# Patient Record
Sex: Female | Born: 1965 | Race: Black or African American | Hispanic: No | Marital: Married | Smoking: Never smoker
Health system: Southern US, Community
[De-identification: ages and names within clinical notes are randomized; demographics above are authoritative.]

## PROBLEM LIST (undated history)

## (undated) DIAGNOSIS — G43909 Migraine, unspecified, not intractable, without status migrainosus: Secondary | ICD-10-CM

## (undated) DIAGNOSIS — D649 Anemia, unspecified: Secondary | ICD-10-CM

## (undated) DIAGNOSIS — K219 Gastro-esophageal reflux disease without esophagitis: Secondary | ICD-10-CM

## (undated) DIAGNOSIS — K648 Other hemorrhoids: Secondary | ICD-10-CM

## (undated) DIAGNOSIS — B3781 Candidal esophagitis: Secondary | ICD-10-CM

## (undated) DIAGNOSIS — I10 Essential (primary) hypertension: Secondary | ICD-10-CM

## (undated) DIAGNOSIS — K635 Polyp of colon: Secondary | ICD-10-CM

## (undated) DIAGNOSIS — K254 Chronic or unspecified gastric ulcer with hemorrhage: Secondary | ICD-10-CM

## (undated) HISTORY — PX: TUBAL LIGATION: SHX77

## (undated) HISTORY — DX: Chronic or unspecified gastric ulcer with hemorrhage: K25.4

## (undated) HISTORY — DX: Candidal esophagitis: B37.81

## (undated) HISTORY — DX: Anemia, unspecified: D64.9

## (undated) HISTORY — PX: ABDOMINAL HYSTERECTOMY: SHX81

## (undated) HISTORY — PX: MULTIPLE TOOTH EXTRACTIONS: SHX2053

## (undated) HISTORY — PX: OTHER SURGICAL HISTORY: SHX169

## (undated) HISTORY — DX: Polyp of colon: K63.5

## (undated) HISTORY — DX: Other hemorrhoids: K64.8

## (undated) HISTORY — PX: FOOT SURGERY: SHX648

---

## 2006-12-06 ENCOUNTER — Emergency Department (HOSPITAL_COMMUNITY): Admission: EM | Admit: 2006-12-06 | Discharge: 2006-12-06 | Payer: Self-pay | Admitting: Emergency Medicine

## 2007-03-24 ENCOUNTER — Emergency Department (HOSPITAL_COMMUNITY): Admission: EM | Admit: 2007-03-24 | Discharge: 2007-03-24 | Payer: Self-pay | Admitting: Emergency Medicine

## 2007-04-24 ENCOUNTER — Emergency Department (HOSPITAL_COMMUNITY): Admission: EM | Admit: 2007-04-24 | Discharge: 2007-04-24 | Payer: Self-pay | Admitting: Emergency Medicine

## 2010-05-24 ENCOUNTER — Emergency Department (HOSPITAL_COMMUNITY)
Admission: EM | Admit: 2010-05-24 | Discharge: 2010-05-24 | Disposition: A | Discharge status: Home / Self Care | Payer: Self-pay | Attending: Emergency Medicine | Admitting: Emergency Medicine

## 2010-05-24 DIAGNOSIS — G44209 Tension-type headache, unspecified, not intractable: Secondary | ICD-10-CM | POA: Insufficient documentation

## 2010-05-24 DIAGNOSIS — R110 Nausea: Secondary | ICD-10-CM | POA: Insufficient documentation

## 2010-05-24 DIAGNOSIS — I10 Essential (primary) hypertension: Secondary | ICD-10-CM | POA: Insufficient documentation

## 2010-05-24 LAB — URINALYSIS, ROUTINE W REFLEX MICROSCOPIC
Nitrite: NEGATIVE
Protein, ur: NEGATIVE mg/dL
Urine Glucose, Fasting: NEGATIVE mg/dL
Urobilinogen, UA: 1 mg/dL (ref 0.0–1.0)

## 2010-05-24 LAB — DIFFERENTIAL
Basophils Absolute: 0 10*3/uL (ref 0.0–0.1)
Basophils Relative: 0 % (ref 0–1)
Eosinophils Absolute: 0 10*3/uL (ref 0.0–0.7)
Neutrophils Relative %: 75 % (ref 43–77)

## 2010-05-24 LAB — COMPREHENSIVE METABOLIC PANEL
AST: 18 U/L (ref 0–37)
Albumin: 3.8 g/dL (ref 3.5–5.2)
Alkaline Phosphatase: 50 U/L (ref 39–117)
Chloride: 107 mEq/L (ref 96–112)
Creatinine, Ser: 0.84 mg/dL (ref 0.4–1.2)
GFR calc Af Amer: >60 mL/min (ref >60)
Potassium: 3.8 mEq/L (ref 3.5–5.1)
Total Bilirubin: 0.6 mg/dL (ref 0.3–1.2)
Total Protein: 7.1 g/dL (ref 6.0–8.3)

## 2010-05-24 LAB — CBC
Platelets: 207 10*3/uL (ref 150–400)
RBC: 4.61 MIL/uL (ref 3.87–5.11)
WBC: 5.8 10*3/uL (ref 4.0–10.5)

## 2011-03-31 ENCOUNTER — Emergency Department (HOSPITAL_COMMUNITY)
Admission: EM | Admit: 2011-03-31 | Discharge: 2011-03-31 | Disposition: A | Discharge status: Home / Self Care | Payer: Self-pay | Attending: Emergency Medicine | Admitting: Emergency Medicine

## 2011-03-31 ENCOUNTER — Emergency Department (HOSPITAL_COMMUNITY): Payer: Self-pay

## 2011-03-31 ENCOUNTER — Encounter: Payer: Self-pay | Admitting: *Deleted

## 2011-03-31 DIAGNOSIS — M545 Low back pain, unspecified: Secondary | ICD-10-CM | POA: Insufficient documentation

## 2011-03-31 DIAGNOSIS — M549 Dorsalgia, unspecified: Secondary | ICD-10-CM | POA: Insufficient documentation

## 2011-03-31 LAB — URINALYSIS, ROUTINE W REFLEX MICROSCOPIC
Bilirubin Urine: NEGATIVE
Glucose, UA: NEGATIVE mg/dL
Hgb urine dipstick: NEGATIVE
Specific Gravity, Urine: 1.014 (ref 1.005–1.030)
Urobilinogen, UA: 0.2 mg/dL (ref 0.0–1.0)

## 2011-03-31 MED ORDER — METHOCARBAMOL 500 MG PO TABS: 1000.0000 mg | ORAL_TABLET | Freq: Four times a day (QID) | ORAL | Status: AC

## 2011-03-31 MED ORDER — IBUPROFEN 800 MG PO TABS: 800.0000 mg | ORAL_TABLET | Freq: Three times a day (TID) | ORAL | Status: AC | PRN

## 2011-03-31 MED ORDER — OXYCODONE-ACETAMINOPHEN 5-325 MG PO TABS
1.0000 | ORAL_TABLET | Freq: Once | ORAL | Status: AC
  Administered 2011-03-31: 1 via ORAL
  Filled 2011-03-31: qty 1

## 2011-03-31 MED ORDER — HYDROCODONE-ACETAMINOPHEN 5-325 MG PO TABS: ORAL_TABLET | ORAL | Status: AC

## 2011-03-31 NOTE — ED Provider Notes (Signed)
History     CSN: [id number]  Arrival date & time 03/31/11  1800   First MD Initiated Contact with Patient 03/31/11 2030      Chief Complaint  Patient presents with  . Back Pain    (Consider location/radiation/quality/duration/timing/severity/associated sxs/prior treatment) HPI Comments: Patient with back pain for the past 2 weeks. Pain is midline of her lumbar spine. Patient states she has radiation of the pain into her right lower extremity. Pain is worse with certain positions and movements. Pain is worse when she coughs. Patient states she has more pain when she bears down to urinate. She denies hematuria or dysuria. Patient denies history of cancer, IV drug use, urinary or bowel incontinence, weakness numbness or tingling in her lower extremities. Patient does state that she has lost 10-15 pounds over the past month. No treatments prior.  Patient is a 45 y.o. female presenting with back pain. The history is provided by the patient.  Back Pain  This is a new problem. The current episode started more than 1 week ago. The problem has been gradually worsening. The pain is associated with no known injury. The pain is present in the lumbar spine. The pain radiates to the right thigh. The symptoms are aggravated by bending and twisting. Associated symptoms include weight loss. Pertinent negatives include no fever, no numbness, no bowel incontinence, no perianal numbness, no bladder incontinence, no pelvic pain, no paresthesias, no tingling and no weakness. She has tried nothing for the symptoms.    History reviewed. No pertinent past medical history.  Past Surgical History  Procedure Date  . Abdominal hysterectomy   . Knee     left "ligament got tore up" surgery  . Tubal ligation     History reviewed. No pertinent family history.  History  Substance Use Topics  . Smoking status: Not on file  . Smokeless tobacco: Not on file  . Alcohol Use:     OB History    Grav Para Term  Preterm Abortions TAB SAB Ect Mult Living                  Review of Systems  Constitutional: Positive for weight loss. Negative for fever.  Gastrointestinal: Negative for bowel incontinence.  Genitourinary: Negative for bladder incontinence and pelvic pain.  Musculoskeletal: Positive for back pain.  Neurological: Negative for tingling, weakness, numbness and paresthesias.    Allergies  Review of patient's allergies indicates no known allergies.  Home Medications  No current outpatient prescriptions on file.  BP 131/91  Pulse 75  Temp(Src) 98.8 F (37.1 C) (Oral)  Resp 16  SpO2 98%  Physical Exam  Nursing note and vitals reviewed. Constitutional: She is oriented to person, place, and time. She appears well-developed and well-nourished.  HENT:  Head: Normocephalic and atraumatic.  Eyes: Conjunctivae are normal.  Neck: Normal range of motion. Neck supple.  Pulmonary/Chest: Effort normal.  Abdominal: Soft. There is no tenderness.  Musculoskeletal: Normal range of motion.       Tenderness to palpation over lumbar/sacral spine. Tenderness to palpation over lumbar paraspinal muscles. No step-off noted with palpation of spine.   Neurological: She is alert and oriented to person, place, and time. She has normal reflexes.  Skin: Skin is warm and dry. No rash noted.  Psychiatric: She has a normal mood and affect.    ED Course  Procedures (including critical care time)   Labs Reviewed  URINALYSIS, ROUTINE W REFLEX MICROSCOPIC   Dg Lumbar Spine Complete  03/31/2011  *RADIOLOGY REPORT*  Clinical Data: Low back pain for 10 days  LUMBAR SPINE - COMPLETE 4+ VIEW  Comparison: None.  Findings: Six non-rib bearing lumbar type vertebrae.  Normal alignment of the lumbar vertebrae and facet joints.  No vertebral compression deformities.  Intervertebral disc space heights are preserved.  No focal bone lesion or bone destruction suggested. Bone cortex and trabecular architecture appear  intact.  IMPRESSION: No displaced fractures identified.  Original Report Authenticated By: Marlon Pel, M.D.     1. Back pain     10:30 PM patient seen and examined. UA and x-ray ordered and are negative. Patient was counseled on back pain precautions and told to do activity as tolerated but do not lift, push, or pull heavy objects more than 10 pounds.  Patient counseled to use ice or heat on back for no longer than 15 minutes every hour.  Questions answered.  Patient verbalized understanding.    10:31 PM Patient counseled on use of narcotic pain medications. Counseled not to combine these medications with others containing tylenol. Urged not to drink alcohol, drive, or perform any other activities that requires focus while taking these medications. The patient verbalizes understanding and agrees with the plan.  10:31 PM Patient counseled on proper use of muscle relaxant medication.  They were told not to drink alcohol, drive any vehicle, or do any dangerous activities while taking this medication.  Patient verbalized understanding.    MDM  Patient with back pain.  No neurological deficits and normal neuro exam.  X-ray ordered given midline tenderness as well as reported weight loss. UA is negative. Patient can walk but states is painful.  No loss of bowel or bladder control.  No concern for cauda equina.  No fever, night sweats, h/o cancer, IVDU.  RICE protocol and pain medicine indicated.           Eustace Moore [place], [place] 03/31/11 2236

## 2011-03-31 NOTE — ED Notes (Signed)
Pt states that she had "the flu" about 2 weeks ago when she noticed a pain in the right side of her back and right flank area.  Pt states that when she coughs the pain is worse and the pain is noted to be gradually worsening.  Pt denies any other mechanism of injury or hematuria.  Pt denies hx of same.

## 2011-04-01 NOTE — ED Provider Notes (Signed)
Medical screening examination/treatment/procedure(s) were performed by non-physician practitioner and as supervising physician I was immediately available for consultation/collaboration.    Burgundy Matuszak L Berna Gitto, MD 04/01/11 1127 

## 2013-04-21 ENCOUNTER — Emergency Department (HOSPITAL_COMMUNITY): Payer: Self-pay

## 2013-04-21 ENCOUNTER — Encounter (HOSPITAL_COMMUNITY): Payer: Self-pay | Admitting: Emergency Medicine

## 2013-04-21 ENCOUNTER — Emergency Department (HOSPITAL_COMMUNITY)
Admission: EM | Admit: 2013-04-21 | Discharge: 2013-04-21 | Disposition: A | Discharge status: Home / Self Care | Payer: Self-pay | Attending: Emergency Medicine | Admitting: Emergency Medicine

## 2013-04-21 DIAGNOSIS — R112 Nausea with vomiting, unspecified: Secondary | ICD-10-CM | POA: Insufficient documentation

## 2013-04-21 DIAGNOSIS — R51 Headache: Secondary | ICD-10-CM | POA: Insufficient documentation

## 2013-04-21 DIAGNOSIS — I10 Essential (primary) hypertension: Secondary | ICD-10-CM | POA: Insufficient documentation

## 2013-04-21 DIAGNOSIS — R519 Headache, unspecified: Secondary | ICD-10-CM

## 2013-04-21 HISTORY — DX: Migraine, unspecified, not intractable, without status migrainosus: G43.909

## 2013-04-21 HISTORY — DX: Essential (primary) hypertension: I10

## 2013-04-21 LAB — POCT I-STAT, CHEM 8
BUN: 7 mg/dL (ref 6–23)
Calcium, Ion: 1.19 mmol/L (ref 1.12–1.23)
Chloride: 97 meq/L (ref 96–112)
Creatinine, Ser: 1 mg/dL (ref 0.50–1.10)
Glucose, Bld: 111 mg/dL — ABNORMAL HIGH (ref 70–99)
HCT: 49 % — ABNORMAL HIGH (ref 36.0–46.0)
Hemoglobin: 16.7 g/dL — ABNORMAL HIGH (ref 12.0–15.0)
Potassium: 3.4 meq/L — ABNORMAL LOW (ref 3.7–5.3)
Sodium: 142 meq/L (ref 137–147)
TCO2: 31 mmol/L (ref 0–100)

## 2013-04-21 MED ORDER — DIPHENHYDRAMINE HCL 50 MG/ML IJ SOLN
25.0000 mg | Freq: Once | INTRAMUSCULAR | Status: AC
  Administered 2013-04-21: 25 mg via INTRAVENOUS
  Filled 2013-04-21: qty 1

## 2013-04-21 MED ORDER — SODIUM CHLORIDE 0.9 % IV SOLN
INTRAVENOUS | Status: DC
  Administered 2013-04-21: 13:00:00 via INTRAVENOUS

## 2013-04-21 MED ORDER — AMLODIPINE BESYLATE 5 MG PO TABS: 5.0000 mg | ORAL_TABLET | Freq: Every day | ORAL | Status: DC

## 2013-04-21 MED ORDER — OXYCODONE-ACETAMINOPHEN 5-325 MG PO TABS: 2.0000 | ORAL_TABLET | ORAL | Status: DC | PRN

## 2013-04-21 MED ORDER — METOCLOPRAMIDE HCL 5 MG/ML IJ SOLN
10.0000 mg | Freq: Once | INTRAMUSCULAR | Status: AC
  Administered 2013-04-21: 10 mg via INTRAVENOUS
  Filled 2013-04-21: qty 2

## 2013-04-21 MED ORDER — MORPHINE SULFATE 2 MG/ML IJ SOLN
2.0000 mg | Freq: Once | INTRAMUSCULAR | Status: AC
  Administered 2013-04-21: 2 mg via INTRAVENOUS
  Filled 2013-04-21 (×2): qty 1

## 2013-04-21 NOTE — ED Provider Notes (Signed)
CSN: [id number]     Arrival date & time 04/21/13  1107 History   First MD Initiated Contact with Patient 04/21/13 1221     Chief Complaint  Patient presents with  . Headache  . Nausea  . Emesis   (Consider location/radiation/quality/duration/timing/severity/associated sxs/prior Treatment) Patient is a 48 y.o. female presenting with headaches and vomiting. The history is provided by the patient.  Headache Associated symptoms: vomiting   Emesis Associated symptoms: headaches    patient here complaining of a two-day history of bitemporal headache similar to her prior migraines. She has had emesis x3 without fever or neck pain. Has not taken her blood pressure medications for over 2 months because of financial reasons. Denies syncope or near-syncope. No chest pain or shortness of breath. Does note some photophobia and phonophobia without visual changes. Use over-the-counter medications without relief.  Past Medical History  Diagnosis Date  . Hypertension   . Migraines    Past Surgical History  Procedure Laterality Date  . Abdominal hysterectomy    . Knee      left "ligament got tore up" surgery  . Tubal ligation     History reviewed. No pertinent family history. History  Substance Use Topics  . Smoking status: Never Smoker   . Smokeless tobacco: Not on file  . Alcohol Use: No   OB History   Grav Para Term Preterm Abortions TAB SAB Ect Mult Living                 Review of Systems  Gastrointestinal: Positive for vomiting.  Neurological: Positive for headaches.  All other systems reviewed and are negative.    Allergies  Review of patient's allergies indicates no known allergies.  Home Medications   Current Outpatient Rx  Name  Route  Sig  Dispense  Refill  . ibuprofen (ADVIL,MOTRIN) 200 MG tablet   Oral   Take 400 mg by mouth every 6 (six) hours as needed.          BP 183/122  Pulse 73  Temp(Src) 98.3 F (36.8 C) (Oral)  Resp 16  SpO2 97% Physical Exam   Nursing note and vitals reviewed. Constitutional: She is oriented to person, place, and time. She appears well-developed and well-nourished.  Non-toxic appearance. No distress.  HENT:  Head: Normocephalic and atraumatic.  Eyes: Conjunctivae, EOM and lids are normal. Pupils are equal, round, and reactive to light.  Neck: Normal range of motion. Neck supple. No tracheal deviation present. No mass present.  Cardiovascular: Normal rate, regular rhythm and normal heart sounds.  Exam reveals no gallop.   No murmur heard. Pulmonary/Chest: Effort normal and breath sounds normal. No stridor. No respiratory distress. She has no decreased breath sounds. She has no wheezes. She has no rhonchi. She has no rales.  Abdominal: Soft. Normal appearance and bowel sounds are normal. She exhibits no distension. There is no tenderness. There is no rebound and no CVA tenderness.  Musculoskeletal: Normal range of motion. She exhibits no edema and no tenderness.  Neurological: She is alert and oriented to person, place, and time. She has normal strength. No cranial nerve deficit or sensory deficit. Coordination and gait normal. GCS eye subscore is 4. GCS verbal subscore is 5. GCS motor subscore is 6.  Skin: Skin is warm and dry. No abrasion and no rash noted.  Psychiatric: She has a normal mood and affect. Her speech is normal and behavior is normal.    ED Course  Procedures (including critical care time)  Labs Review Labs Reviewed - No data to display Imaging Review No results found.  EKG Interpretation   None       MDM  No diagnosis found. Patient given meds for her headache and does feel better. Neurological assessment stable. Will be started on Norvasc for hypertension and given referral to the [facility], MD 04/21/13 1346

## 2013-04-21 NOTE — Discharge Instructions (Signed)
Arterial Hypertension °Arterial hypertension (high blood pressure) is a condition of elevated pressure in your blood vessels. Hypertension over a long period of time is a risk factor for strokes, heart attacks, and heart failure. It is also the leading cause of kidney (renal) failure.  °CAUSES  °· In Adults -- Over 90% of all hypertension has no known cause. This is called essential or primary hypertension. In the other 10% of people with hypertension, the increase in blood pressure is caused by another disorder. This is called secondary hypertension. Important causes of secondary hypertension are: °· Heavy alcohol use. °· Obstructive sleep apnea. °· Hyperaldosterosim (Conn's syndrome). °· Steroid use. °· Chronic kidney failure. °· Hyperparathyroidism. °· Medications. °· Renal artery stenosis. °· Pheochromocytoma. °· Cushing's disease. °· Coarctation of the aorta. °· Scleroderma renal crisis. °· Licorice (in excessive amounts). °· Drugs (cocaine, methamphetamine). °Your caregiver can explain any items above that apply to you. °· In Children -- Secondary hypertension is more common and should always be considered. °· Pregnancy -- Few women of childbearing age have high blood pressure. However, up to 10% of them develop hypertension of pregnancy. Generally, this will not harm the woman. It may be a sign of 3 complications of pregnancy: preeclampsia, HELLP syndrome, and eclampsia. Follow up and control with medication is necessary. °SYMPTOMS  °· This condition normally does not produce any noticeable symptoms. It is usually found during a routine exam. °· Malignant hypertension is a late problem of high blood pressure. It may have the following symptoms: °· Headaches. °· Blurred vision. °· End-organ damage (this means your kidneys, heart, lungs, and other organs are being damaged). °· Stressful situations can increase the blood pressure. If a person with normal blood pressure has their blood pressure go up while being  seen by their caregiver, this is often termed "white coat hypertension." Its importance is not known. It may be related with eventually developing hypertension or complications of hypertension. °· Hypertension is often confused with mental tension, stress, and anxiety. °DIAGNOSIS  °The diagnosis is made by 3 separate blood pressure measurements. They are taken at least 1 week apart from each other. If there is organ damage from hypertension, the diagnosis may be made without repeat measurements. °Hypertension is usually identified by having blood pressure readings: °· Above 140/90 mmHg measured in both arms, at 3 separate times, over a couple weeks. °· Over 130/80 mmHg should be considered a risk factor and may require treatment in patients with diabetes. °Blood pressure readings over 120/80 mmHg are called "pre-hypertension" even in non-diabetic patients. °To get a true blood pressure measurement, use the following guidelines. Be aware of the factors that can alter blood pressure readings. °· Take measurements at least 1 hour after caffeine. °· Take measurements 30 minutes after smoking and without any stress. This is another reason to quit smoking  it raises your blood pressure. °· Use a proper cuff size. Ask your caregiver if you are not sure about your cuff size. °· Most home blood pressure cuffs are automatic. They will measure systolic and diastolic pressures. The systolic pressure is the pressure reading at the start of sounds. Diastolic pressure is the pressure at which the sounds disappear. If you are elderly, measure pressures in multiple postures. Try sitting, lying or standing. °· Sit at rest for a minimum of 5 minutes before taking measurements. °· You should not be on any medications like decongestants. These are found in many cold medications. °· Record your blood pressure readings and review   them with your caregiver. °If you have hypertension: °· Your caregiver may do tests to be sure you do not have  secondary hypertension (see "causes" above). °· Your caregiver may also look for signs of metabolic syndrome. This is also called Syndrome X or Insulin Resistance Syndrome. You may have this syndrome if you have type 2 diabetes, abdominal obesity, and abnormal blood lipids in addition to hypertension. °· Your caregiver will take your medical and family history and perform a physical exam. °· Diagnostic tests may include blood tests (for glucose, cholesterol, potassium, and kidney function), a urinalysis, or an EKG. Other tests may also be necessary depending on your condition. °PREVENTION  °There are important lifestyle issues that you can adopt to reduce your chance of developing hypertension: °· Maintain a normal weight. °· Limit the amount of salt (sodium) in your diet. °· Exercise often. °· Limit alcohol intake. °· Get enough potassium in your diet. Discuss specific advice with your caregiver. °· Follow a DASH diet (dietary approaches to stop hypertension). This diet is rich in fruits, vegetables, and low-fat dairy products, and avoids certain fats. °PROGNOSIS  °Essential hypertension cannot be cured. Lifestyle changes and medical treatment can lower blood pressure and reduce complications. The prognosis of secondary hypertension depends on the underlying cause. Many people whose hypertension is controlled with medicine or lifestyle changes can live a normal, healthy life.  °RISKS AND COMPLICATIONS  °While high blood pressure alone is not an illness, it often requires treatment due to its short- and long-term effects on many organs. Hypertension increases your risk for: °· CVAs or strokes (cerebrovascular accident). °· Heart failure due to chronically high blood pressure (hypertensive cardiomyopathy). °· Heart attack (myocardial infarction). °· Damage to the retina (hypertensive retinopathy). °· Kidney failure (hypertensive nephropathy). °Your caregiver can explain list items above that apply to you. Treatment  of hypertension can significantly reduce the risk of complications. °TREATMENT  °· For overweight patients, weight loss and regular exercise are recommended. Physical fitness lowers blood pressure. °· Mild hypertension is usually treated with diet and exercise. A diet rich in fruits and vegetables, fat-free dairy products, and foods low in fat and salt (sodium) can help lower blood pressure. Decreasing salt intake decreases blood pressure in a 1/3 of people. °· Stop smoking if you are a smoker. °The steps above are highly effective in reducing blood pressure. While these actions are easy to suggest, they are difficult to achieve. Most patients with moderate or severe hypertension end up requiring medications to bring their blood pressure down to a normal level. There are several classes of medications for treatment. Blood pressure pills (antihypertensives) will lower blood pressure by their different actions. Lowering the blood pressure by 10 mmHg may decrease the risk of complications by as much as 25%. °The goal of treatment is effective blood pressure control. This will reduce your risk for complications. Your caregiver will help you determine the best treatment for you according to your lifestyle. What is excellent treatment for one person, may not be for you. °HOME CARE INSTRUCTIONS  °· Do not smoke. °· Follow the lifestyle changes outlined in the "Prevention" section. °· If you are on medications, follow the directions carefully. Blood pressure medications must be taken as prescribed. Skipping doses reduces their benefit. It also puts you at risk for problems. °· Follow up with your caregiver, as directed. °· If you are asked to monitor your blood pressure at home, follow the guidelines in the "Diagnosis" section above. °SEEK MEDICAL CARE   IF:  °· You think you are having medication side effects. °· You have recurrent headaches or lightheadedness. °· You have swelling in your ankles. °· You have trouble with  your vision. °SEEK IMMEDIATE MEDICAL CARE IF:  °· You have sudden onset of chest pain or pressure, difficulty breathing, or other symptoms of a heart attack. °· You have a severe headache. °· You have symptoms of a stroke (such as sudden weakness, difficulty speaking, difficulty walking). °MAKE SURE YOU:  °· Understand these instructions. °· Will watch your condition. °· Will get help right away if you are not doing well or get worse. °Document Released: 03/19/2005 Document Revised: 06/11/2011 Document Reviewed: 10/17/2006 °Exit[employer]® Patient Information ©2014 [employer]. ° °Migraine Headache °A migraine headache is an intense, throbbing pain on one or both sides of your head. A migraine can last for 30 minutes to several hours. °CAUSES  °The exact cause of a migraine headache is not always known. However, a migraine may be caused when nerves in the brain become irritated and release chemicals that cause inflammation. This causes pain. °Certain things may also trigger migraines, such as: °· Alcohol. °· Smoking. °· Stress. °· Menstruation. °· Aged cheeses. °· Foods or drinks that contain nitrates, glutamate, aspartame, or tyramine. °· Lack of sleep. °· Chocolate. °· Caffeine. °· Hunger. °· Physical exertion. °· Fatigue. °· Medicines used to treat chest pain (nitroglycerine), birth control pills, estrogen, and some blood pressure medicines. °SIGNS AND SYMPTOMS °· Pain on one or both sides of your head. °· Pulsating or throbbing pain. °· Severe pain that prevents daily activities. °· Pain that is aggravated by any physical activity. °· Nausea, vomiting, or both. °· Dizziness. °· Pain with exposure to bright lights, loud noises, or activity. °· General sensitivity to bright lights, loud noises, or smells. °Before you get a migraine, you may get warning signs that a migraine is coming (aura). An aura may include: °· Seeing flashing lights. °· Seeing bright spots, halos, or zig-zag lines. °· Having tunnel vision or  blurred vision. °· Having feelings of numbness or tingling. °· Having trouble talking. °· Having muscle weakness. °DIAGNOSIS  °A migraine headache is often diagnosed based on: °· Symptoms. °· Physical exam. °· A CT scan or MRI of your head. These imaging tests cannot diagnose migraines, but they can help rule out other causes of headaches. °TREATMENT °Medicines may be given for pain and nausea. Medicines can also be given to help prevent recurrent migraines.  °HOME CARE INSTRUCTIONS °· Only take over-the-counter or prescription medicines for pain or discomfort as directed by your health care provider. The use of long-term narcotics is not recommended. °· Lie down in a dark, quiet room when you have a migraine. °· Keep a journal to find out what may trigger your migraine headaches. For example, write down: °· What you eat and drink. °· How much sleep you get. °· Any change to your diet or medicines. °· Limit alcohol consumption. °· Quit smoking if you smoke. °· Get 7 9 hours of sleep, or as recommended by your health care provider. °· Limit stress. °· Keep lights dim if bright lights bother you and make your migraines worse. °SEEK IMMEDIATE MEDICAL CARE IF:  °· Your migraine becomes severe. °· You have a fever. °· You have a stiff neck. °· You have vision loss. °· You have muscular weakness or loss of muscle control. °· You start losing your balance or have trouble walking. °· You feel faint or pass out. °·   You have severe symptoms that are different from your first symptoms. °MAKE SURE YOU:  °· Understand these instructions. °· Will watch your condition. °· Will get help right away if you are not doing well or get worse. °Document Released: 03/19/2005 Document Revised: 01/07/2013 Document Reviewed: 11/24/2012 °ExitCare® Patient Information ©2014 [employer]. ° °

## 2013-04-21 NOTE — Progress Notes (Signed)
[facility] provided pt with a list of primary care resources, [facility] information, and [facility] Orange Card application. °

## 2013-04-21 NOTE — ED Notes (Signed)
Pt w/ hx of migraines and htn c/o headache since yesterday w/ NV.  States she has thrown up 3 times.  States that she cannot afford her BP meds.

## 2013-04-21 NOTE — ED Notes (Signed)
Patient transported to CT 

## 2013-05-25 ENCOUNTER — Ambulatory Visit: Payer: Self-pay

## 2014-04-02 DIAGNOSIS — K254 Chronic or unspecified gastric ulcer with hemorrhage: Secondary | ICD-10-CM

## 2014-04-02 HISTORY — DX: Chronic or unspecified gastric ulcer with hemorrhage: K25.4

## 2014-06-02 ENCOUNTER — Emergency Department (HOSPITAL_COMMUNITY)
Admission: EM | Admit: 2014-06-02 | Discharge: 2014-06-02 | Disposition: A | Discharge status: Home / Self Care | Payer: Self-pay | Attending: Emergency Medicine | Admitting: Emergency Medicine

## 2014-06-02 ENCOUNTER — Encounter (HOSPITAL_COMMUNITY): Payer: Self-pay | Admitting: Emergency Medicine

## 2014-06-02 ENCOUNTER — Emergency Department (HOSPITAL_COMMUNITY): Payer: Self-pay

## 2014-06-02 DIAGNOSIS — R519 Headache, unspecified: Secondary | ICD-10-CM

## 2014-06-02 DIAGNOSIS — G43909 Migraine, unspecified, not intractable, without status migrainosus: Secondary | ICD-10-CM | POA: Insufficient documentation

## 2014-06-02 DIAGNOSIS — R51 Headache: Secondary | ICD-10-CM

## 2014-06-02 DIAGNOSIS — Z79899 Other long term (current) drug therapy: Secondary | ICD-10-CM | POA: Insufficient documentation

## 2014-06-02 DIAGNOSIS — I10 Essential (primary) hypertension: Secondary | ICD-10-CM

## 2014-06-02 MED ORDER — METOCLOPRAMIDE HCL 10 MG PO TABS
10.0000 mg | ORAL_TABLET | Freq: Once | ORAL | Status: DC
  Filled 2014-06-02: qty 1

## 2014-06-02 MED ORDER — METOCLOPRAMIDE HCL 5 MG/ML IJ SOLN
10.0000 mg | Freq: Once | INTRAMUSCULAR | Status: AC
  Administered 2014-06-02: 10 mg via INTRAVENOUS
  Filled 2014-06-02: qty 2

## 2014-06-02 MED ORDER — AMLODIPINE BESYLATE 5 MG PO TABS: 5.0000 mg | ORAL_TABLET | Freq: Every day | ORAL | Status: DC

## 2014-06-02 MED ORDER — DIPHENHYDRAMINE HCL 50 MG/ML IJ SOLN
25.0000 mg | Freq: Once | INTRAMUSCULAR | Status: AC
  Administered 2014-06-02: 25 mg via INTRAVENOUS
  Filled 2014-06-02: qty 1

## 2014-06-02 MED ORDER — KETOROLAC TROMETHAMINE 30 MG/ML IJ SOLN
30.0000 mg | Freq: Once | INTRAMUSCULAR | Status: AC
  Administered 2014-06-02: 30 mg via INTRAVENOUS
  Filled 2014-06-02: qty 1

## 2014-06-02 NOTE — ED Notes (Signed)
Pt c/o headache onset yesterday with nausea and vomiting.  Pt has hx of HTN but does not take any meds for same

## 2014-06-02 NOTE — Discharge Instructions (Signed)

## 2014-06-02 NOTE — ED Notes (Signed)
Patient transported to CT 

## 2014-06-02 NOTE — ED Provider Notes (Signed)
CSN: [id number]     Arrival date & time 06/02/14  1817 History   First MD Initiated Contact with Patient 06/02/14 1941     Chief Complaint  Patient presents with  . Headache     (Consider location/radiation/quality/duration/timing/severity/associated sxs/prior Treatment) HPI Comments: Frontal headache gradual onset since yesterday with 2 episodes of nausea and vomiting. History of hypertension noncompliance with medications. No fever. No focal weakness, numbness or tingling. No bowel or bladder incontinence. No chest pain or shortness of breath. Denies thunderclap onset. Denies any fever. Took ibuprofen without relief.  The history is provided by the patient.    Past Medical History  Diagnosis Date  . Hypertension   . Migraines    Past Surgical History  Procedure Laterality Date  . Abdominal hysterectomy    . Knee      left "ligament got tore up" surgery  . Tubal ligation     No family history on file. History  Substance Use Topics  . Smoking status: Never Smoker   . Smokeless tobacco: Not on file  . Alcohol Use: No   OB History    No data available     Review of Systems  Constitutional: Negative for fever, activity change and appetite change.  HENT: Negative for congestion and rhinorrhea.   Eyes: Positive for photophobia. Negative for visual disturbance.  Respiratory: Negative for cough, chest tightness and shortness of breath.   Cardiovascular: Negative for chest pain.  Gastrointestinal: Negative for nausea, vomiting and abdominal pain.  Genitourinary: Negative for dysuria, hematuria, vaginal bleeding and vaginal discharge.  Musculoskeletal: Negative for myalgias and arthralgias.  Skin: Negative for rash.  Neurological: Positive for weakness and headaches. Negative for dizziness and light-headedness.  A complete 10 system review of systems was obtained and all systems are negative except as noted in the HPI and PMH.      Allergies  Review of patient's allergies  indicates no known allergies.  Home Medications   Prior to Admission medications   Medication Sig Start Date End Date Taking? Authorizing Provider  ibuprofen (ADVIL,MOTRIN) 200 MG tablet Take 400 mg by mouth every 6 (six) hours as needed for headache, mild pain or moderate pain.    Yes Historical Provider, MD  amLODipine (NORVASC) 5 MG tablet Take 1 tablet (5 mg total) by mouth daily. 06/02/14   Ezequiel Essex, MD  oxyCODONE-acetaminophen (PERCOCET/ROXICET) 5-325 MG per tablet Take 2 tablets by mouth every 4 (four) hours as needed for severe pain. Patient not taking: Reported on 06/02/2014 04/21/13   Leota Jacobsen, MD   BP 139/83 mmHg  Pulse 66  Temp(Src) 98.8 F (37.1 C)  Resp 20  Wt 135 lb (61.236 kg)  SpO2 96% Physical Exam  Constitutional: She is oriented to person, place, and time. She appears well-developed and well-nourished. No distress.  HENT:  Head: Normocephalic and atraumatic.  Mouth/Throat: Oropharynx is clear and moist. No oropharyngeal exudate.  No temporal artery tenderness  Eyes: Conjunctivae and EOM are normal. Pupils are equal, round, and reactive to light.  Neck: Normal range of motion. Neck supple.  No meningismus.  Cardiovascular: Normal rate, regular rhythm, normal heart sounds and intact distal pulses.   No murmur heard. Pulmonary/Chest: Effort normal and breath sounds normal. No respiratory distress.  Abdominal: Soft. There is no tenderness. There is no rebound and no guarding.  Musculoskeletal: Normal range of motion. She exhibits no edema or tenderness.  Neurological: She is alert and oriented to person, place, and time. No cranial nerve  deficit. She exhibits normal muscle tone. Coordination normal.  No ataxia on finger to nose bilaterally. No pronator drift. 5/5 strength throughout. CN 2-12 intact. Negative Romberg. Equal grip strength. Sensation intact. Gait is normal.   Skin: Skin is warm.  Psychiatric: She has a normal mood and affect. Her behavior is  normal.  Nursing note and vitals reviewed.   ED Course  Procedures (including critical care time) Labs Review Labs Reviewed - No data to display  Imaging Review Ct Head Wo Contrast  06/02/2014   CLINICAL DATA:  Headache and hypertension.  EXAM: CT HEAD WITHOUT CONTRAST  TECHNIQUE: Contiguous axial images were obtained from the base of the skull through the vertex without intravenous contrast.  COMPARISON:  04/21/2013  FINDINGS: The brain demonstrates no evidence of hemorrhage, infarction, edema, mass effect, extra-axial fluid collection, hydrocephalus or mass lesion. The skull is unremarkable.  IMPRESSION: Normal head CT.   Electronically Signed   By: Aletta Edouard M.D.   On: 06/02/2014 20:36     EKG Interpretation None      MDM   Final diagnoses:  Headache, unspecified headache type  Essential hypertension   Hypertension with gradual onset headache for 2 days, nausea and vomiting. No focal neuro deficits.  CT head normal. No focal neurological deficits. Is gradual onset headache. Low suspicion for some subarachnoid hemorrhage, meningitis, temporal arteritis.  Headache resolved after treatment in the ED.  We'll restart blood pressure medication. Follow-up with PCP and wellness center. Return precautions discussed.     Ezequiel Essex, MD 06/02/14 2330

## 2015-02-23 ENCOUNTER — Observation Stay (HOSPITAL_COMMUNITY)
Admission: EM | Admit: 2015-02-23 | Discharge: 2015-02-26 | Disposition: A | Discharge status: Home / Self Care | Payer: Self-pay | Attending: Internal Medicine | Admitting: Internal Medicine

## 2015-02-23 ENCOUNTER — Encounter (HOSPITAL_COMMUNITY): Payer: Self-pay | Admitting: Emergency Medicine

## 2015-02-23 DIAGNOSIS — G43009 Migraine without aura, not intractable, without status migrainosus: Secondary | ICD-10-CM

## 2015-02-23 DIAGNOSIS — K922 Gastrointestinal hemorrhage, unspecified: Principal | ICD-10-CM

## 2015-02-23 DIAGNOSIS — G43909 Migraine, unspecified, not intractable, without status migrainosus: Secondary | ICD-10-CM

## 2015-02-23 DIAGNOSIS — K649 Unspecified hemorrhoids: Secondary | ICD-10-CM | POA: Insufficient documentation

## 2015-02-23 DIAGNOSIS — K921 Melena: Secondary | ICD-10-CM | POA: Diagnosis present

## 2015-02-23 DIAGNOSIS — Z8711 Personal history of peptic ulcer disease: Secondary | ICD-10-CM | POA: Insufficient documentation

## 2015-02-23 DIAGNOSIS — R1013 Epigastric pain: Secondary | ICD-10-CM

## 2015-02-23 DIAGNOSIS — K2950 Unspecified chronic gastritis without bleeding: Secondary | ICD-10-CM | POA: Insufficient documentation

## 2015-02-23 DIAGNOSIS — I10 Essential (primary) hypertension: Secondary | ICD-10-CM

## 2015-02-23 LAB — TYPE AND SCREEN
ABO/RH(D): O POS
Antibody Screen: NEGATIVE

## 2015-02-23 LAB — COMPREHENSIVE METABOLIC PANEL
ALBUMIN: 4.1 g/dL (ref 3.5–5.0)
ALK PHOS: 47 U/L (ref 38–126)
ALT: 17 U/L (ref 14–54)
ANION GAP: 8 (ref 5–15)
AST: 21 U/L (ref 15–41)
BUN: 12 mg/dL (ref 6–20)
CHLORIDE: 106 mmol/L (ref 101–111)
CO2: 26 mmol/L (ref 22–32)
Calcium: 8.8 mg/dL — ABNORMAL LOW (ref 8.9–10.3)
Creatinine, Ser: 1.02 mg/dL — ABNORMAL HIGH (ref 0.44–1.00)
GFR calc Af Amer: >60 mL/min (ref >60)
GFR calc non Af Amer: >60 mL/min (ref >60)
GLUCOSE: 126 mg/dL — AB (ref 65–99)
POTASSIUM: 3.6 mmol/L (ref 3.5–5.1)
SODIUM: 140 mmol/L (ref 135–145)
Total Bilirubin: 0.3 mg/dL (ref 0.3–1.2)
Total Protein: 7.3 g/dL (ref 6.5–8.1)

## 2015-02-23 LAB — CBC
HEMATOCRIT: 34.1 % — AB (ref 36.0–46.0)
HEMOGLOBIN: 11.7 g/dL — AB (ref 12.0–15.0)
MCH: 30.4 pg (ref 26.0–34.0)
MCHC: 34.3 g/dL (ref 30.0–36.0)
MCV: 88.6 fL (ref 78.0–100.0)
Platelets: 177 10*3/uL (ref 150–400)
RBC: 3.85 MIL/uL — AB (ref 3.87–5.11)
RDW: 14.2 % (ref 11.5–15.5)
WBC: 6.8 10*3/uL (ref 4.0–10.5)

## 2015-02-23 LAB — URINALYSIS, ROUTINE W REFLEX MICROSCOPIC
Bilirubin Urine: NEGATIVE
Glucose, UA: NEGATIVE mg/dL
Hgb urine dipstick: NEGATIVE
KETONES UR: NEGATIVE mg/dL
Nitrite: NEGATIVE
PROTEIN: NEGATIVE mg/dL
Specific Gravity, Urine: 1.012 (ref 1.005–1.030)
pH: 6.5 (ref 5.0–8.0)

## 2015-02-23 LAB — POC OCCULT BLOOD, ED: FECAL OCCULT BLD: POSITIVE — AB

## 2015-02-23 LAB — URINE MICROSCOPIC-ADD ON

## 2015-02-23 LAB — ABO/RH: ABO/RH(D): O POS

## 2015-02-23 MED ORDER — BOOST / RESOURCE BREEZE PO LIQD
1.0000 | Freq: Three times a day (TID) | ORAL | Status: DC
  Administered 2015-02-24 – 2015-02-26 (×3): 1 via ORAL

## 2015-02-23 MED ORDER — AMLODIPINE BESYLATE 5 MG PO TABS
5.0000 mg | ORAL_TABLET | Freq: Every day | ORAL | Status: DC
  Administered 2015-02-24 – 2015-02-26 (×3): 5 mg via ORAL
  Filled 2015-02-23 (×4): qty 1

## 2015-02-23 MED ORDER — ONDANSETRON HCL 4 MG PO TABS: 4.0000 mg | ORAL_TABLET | Freq: Four times a day (QID) | ORAL | Status: DC | PRN

## 2015-02-23 MED ORDER — INFLUENZA VAC SPLIT QUAD 0.5 ML IM SUSY
0.5000 mL | PREFILLED_SYRINGE | INTRAMUSCULAR | Status: AC
  Administered 2015-02-26: 0.5 mL via INTRAMUSCULAR
  Filled 2015-02-23 (×2): qty 0.5

## 2015-02-23 MED ORDER — PANTOPRAZOLE SODIUM 40 MG IV SOLR
40.0000 mg | INTRAVENOUS | Status: AC
  Administered 2015-02-23: 40 mg via INTRAVENOUS
  Filled 2015-02-23: qty 40

## 2015-02-23 MED ORDER — PANTOPRAZOLE SODIUM 40 MG IV SOLR
40.0000 mg | Freq: Two times a day (BID) | INTRAVENOUS | Status: DC
  Administered 2015-02-23 – 2015-02-26 (×6): 40 mg via INTRAVENOUS
  Filled 2015-02-23 (×7): qty 40

## 2015-02-23 MED ORDER — ONDANSETRON HCL 4 MG/2ML IJ SOLN
4.0000 mg | Freq: Four times a day (QID) | INTRAMUSCULAR | Status: DC | PRN
  Administered 2015-02-23 – 2015-02-26 (×4): 4 mg via INTRAVENOUS
  Filled 2015-02-23 (×5): qty 2

## 2015-02-23 MED ORDER — SODIUM CHLORIDE 0.9 % IV SOLN
INTRAVENOUS | Status: DC
  Administered 2015-02-23 – 2015-02-26 (×5): via INTRAVENOUS

## 2015-02-23 NOTE — ED Notes (Signed)
Pt states over the last 2 days complaining of occasional abdominal pain with dark foul smelling bloody diarrhea. Denies nausea, vomiting, dizziness, feeling faint/syncope, fevers/chills.

## 2015-02-23 NOTE — ED Provider Notes (Signed)
CSN: [id number]     Arrival date & time 02/23/15  1329 History   First MD Initiated Contact with Patient 02/23/15 1455     Chief Complaint  Patient presents with  . Melena  . Abdominal Pain     (Consider location/radiation/quality/duration/timing/severity/associated sxs/prior Treatment) HPI Comments: 49yo F w/ PMH including HTN, PUD, migraines who presents with bloody stool. Patient states that yesterday she began having dark, bloody diarrhea which she notes filled the toilet bowl with blood. She had another episode of bloody diarrhea today. She reports intermittent midepigastric abdominal pain. Nothing makes the pain better or worse. She denies any pain currently. She denies any associated nausea, vomiting, lightheadedness, fevers, or chills. She used NSAIDs for 3 days last week for headache but denies daily NSAID use. She denies alcohol use. She has a history of hemorrhoids associated with constipation but states that this is different. She denies any recent problems with constipation. She does note a distant history of ulcers noted on EGD but is not currently on any medication for it.  Patient is a 49 y.o. female presenting with abdominal pain. The history is provided by the patient.  Abdominal Pain   Past Medical History  Diagnosis Date  . Hypertension   . Migraines    Past Surgical History  Procedure Laterality Date  . Abdominal hysterectomy    . Knee      left "ligament got tore up" surgery  . Tubal ligation     History reviewed. No pertinent family history. Social History  Substance Use Topics  . Smoking status: Never Smoker   . Smokeless tobacco: None  . Alcohol Use: No   OB History    No data available     Review of Systems  Gastrointestinal: Positive for abdominal pain.    10 Systems reviewed and are negative for acute change except as noted in the HPI.   Allergies  Review of patient's allergies indicates no known allergies.  Home Medications   Prior to  Admission medications   Medication Sig Start Date End Date Taking? Authorizing Provider  ibuprofen (ADVIL,MOTRIN) 200 MG tablet Take 400 mg by mouth every 6 (six) hours as needed for headache, mild pain or moderate pain.    Yes Historical Provider, MD  amLODipine (NORVASC) 5 MG tablet Take 1 tablet (5 mg total) by mouth daily. Patient not taking: Reported on 02/23/2015 06/02/14   Ezequiel Essex, MD   BP 145/95 mmHg  Pulse 77  Temp(Src) 98.5 F (36.9 C) (Oral)  Resp 16  SpO2 100% Physical Exam  Constitutional: She is oriented to person, place, and time. She appears well-developed and well-nourished. No distress.  HENT:  Head: Normocephalic and atraumatic.  Moist mucous membranes  Eyes: Conjunctivae are normal. Pupils are equal, round, and reactive to light.  Neck: Neck supple.  Cardiovascular: Normal rate, regular rhythm and normal heart sounds.   No murmur heard. Pulmonary/Chest: Effort normal and breath sounds normal.  Abdominal: Soft. Bowel sounds are normal. She exhibits no distension. There is no tenderness.  Genitourinary:  Normal rectal tone, faintly blood-tinged stool but no gross melena/hematochezia  Musculoskeletal: She exhibits no edema.  Neurological: She is alert and oriented to person, place, and time.  Fluent speech, normal gait  Skin: Skin is warm and dry.  Psychiatric: She has a normal mood and affect. Judgment normal.  Nursing note and vitals reviewed. Chaperone was present during exam.   ED Course  Procedures (including critical care time) Labs Review Labs Reviewed  COMPREHENSIVE METABOLIC PANEL - Abnormal; Notable for the following:    Glucose, Bld 126 (*)    Creatinine, Ser 1.02 (*)    Calcium 8.8 (*)    All other components within normal limits  CBC - Abnormal; Notable for the following:    RBC 3.85 (*)    Hemoglobin 11.7 (*)    HCT 34.1 (*)    All other components within normal limits  URINALYSIS, ROUTINE W REFLEX MICROSCOPIC (NOT AT [facility]) -  Abnormal; Notable for the following:    Leukocytes, UA SMALL (*)    All other components within normal limits  URINE MICROSCOPIC-ADD ON - Abnormal; Notable for the following:    Squamous Epithelial / LPF 0-5 (*)    Bacteria, UA FEW (*)    All other components within normal limits  POC OCCULT BLOOD, ED - Abnormal; Notable for the following:    Fecal Occult Bld POSITIVE (*)    All other components within normal limits  POC URINE PREG, ED  TYPE AND SCREEN  ABO/RH    Imaging Review No results found. I have personally reviewed and evaluated these lab results as part of my medical decision-making.   EKG Interpretation None     Medications  pantoprazole (PROTONIX) injection 40 mg (40 mg Intravenous Given 02/23/15 1659)    MDM   Final diagnoses:  Bloody stool  Epigastric pain   Patient presents with 2 days of bloody diarrhea associated with intermittent midepigastric pain. On exam, patient well-appearing with reassuring vital signs. No abdominal tenderness. No frank melena or hematochezia on exam. Lab work shows hemoglobin 11.7 which is lower than hemoglobin of 16 from 2015. Hemoccult positive. I am concerned about ongoing blood loss; ddx includes PUD or colon polyp/mass. I have recommended admission for scope and after lengthy discussion patient has agreed. She remained stable with reassuring vital signs. I have given pt protonix and discussed w/ gastroenterology, Dr. Michail Sermon, who will see pt in consultation. Discussed with [facility] and patient admitted to general medicine for further care.   Sharlett Iles, MD 02/23/15 [phone]

## 2015-02-23 NOTE — H&P (Signed)
[facility] History and Physical  Tina Arroyo [id number] DOB: [birth year] DOA: 02/23/2015  Referring physician: Emergency Department PCP: No PCP Per Patient  Specialists:   Chief Complaint: Black stools  HPI: Tina Arroyo is a 49 y.o. female with a hx of migraines on NSAIDs and HTN who presents wtih significant amounts of dark tarry stools x 1-2 days prior to admission. Pt also reports mild epigastric pain.  In the ED, pt was found to have hgb of 11.7 (last was 16.7 one year prior). Patient was started on protonix. GI consulted and hospitalist consulted for consideration for admission.  Review of Systems:  Review of Systems  Constitutional: Negative for fever and malaise/fatigue.  HENT: Negative for ear pain and tinnitus.   Eyes: Negative for discharge and redness.  Respiratory: Negative for shortness of breath and wheezing.   Cardiovascular: Negative for chest pain and palpitations.  Gastrointestinal: Positive for abdominal pain and melena.  Musculoskeletal: Negative for back pain and joint pain.  Neurological: Negative for tremors, seizures and loss of consciousness.  Psychiatric/Behavioral: Negative for memory loss. The patient is not nervous/anxious.      Past Medical History  Diagnosis Date  . Hypertension   . Migraines    Past Surgical History  Procedure Laterality Date  . Abdominal hysterectomy    . Knee      left "ligament got tore up" surgery  . Tubal ligation     Social History:  reports that she has never smoked. She does not have any smokeless tobacco history on file. She reports that she does not drink alcohol or use illicit drugs.  where does patient live--home, ALF, SNF? and with whom if at home?  Can patient participate in ADLs?   No Known Allergies  History reviewed. No pertinent family history.   Prior to Admission medications   Medication Sig Start Date End Date Taking? Authorizing Provider  ibuprofen (ADVIL,MOTRIN) 200 MG tablet Take  400 mg by mouth every 6 (six) hours as needed for headache, mild pain or moderate pain.    Yes Historical Provider, MD  amLODipine (NORVASC) 5 MG tablet Take 1 tablet (5 mg total) by mouth daily. Patient not taking: Reported on 02/23/2015 06/02/14   Ezequiel Essex, MD   Physical Exam: Filed Vitals:   02/23/15 1335 02/23/15 1458  BP: 150/100 145/95  Pulse: 80 77  Temp: 98.5 F (36.9 C)   TempSrc: Oral   Resp: 18 16  SpO2: 100% 100%     General:  Awake, in nad  Eyes: PERRL B  ENT: membranes moist, dentition fair  Neck: trachea midline, neck supple  Cardiovascular: regular, s1, s2  Respiratory: normal resp effort, no wheezing  Abdomen: soft,nondistended  Skin: normal skin turgor, no abnormal skin lesions seen  Musculoskeletal: perfused, no clubbing  Psychiatric: mood/affect normal// no auditory/visual hallucinations  Neurologic: cn2-12 grossly intact, strength/sensation intact  Labs on Admission:  Basic Metabolic Panel:  Recent Labs Lab 02/23/15 1401  NA 140  K 3.6  CL 106  CO2 26  GLUCOSE 126*  BUN 12  CREATININE 1.02*  CALCIUM 8.8*   Liver Function Tests:  Recent Labs Lab 02/23/15 1401  AST 21  ALT 17  ALKPHOS 47  BILITOT 0.3  PROT 7.3  ALBUMIN 4.1   No results for input(s): LIPASE, AMYLASE in the last 168 hours. No results for input(s): AMMONIA in the last 168 hours. CBC:  Recent Labs Lab 02/23/15 1401  WBC 6.8  HGB 11.7*  HCT 34.1*  MCV 88.6  PLT 177   Cardiac Enzymes: No results for input(s): CKTOTAL, CKMB, CKMBINDEX, TROPONINI in the last 168 hours.  BNP (last 3 results) No results for input(s): BNP in the last 8760 hours.  ProBNP (last 3 results) No results for input(s): PROBNP in the last 8760 hours.  CBG: No results for input(s): GLUCAP in the last 168 hours.  Radiological Exams on Admission: No results found.   Assessment/Plan Principal Problem:   Upper GI bleed Active Problems:   HTN (hypertension)    Migraine   1. Upper GI bleeding 1. Hgb presently stable 2. Pt's stool heme positive 3. GI consulted 4. Will continue IV protonix 5. Will admit to med-surg 2. HTN 1. BP stable 2. Cont home meds for now 3. Hx migraines 1. Stable 2. Avoid NSAIDS for now secondary to above 4. DVT prophylaxis 1. SCD's  Code Status: Full Family Communication: Pt in room Disposition Plan: Admit to [employer] [facility] Pager [phone]  If 7PM-7AM, please contact night-coverage [email or web address] Password [facility] 02/23/2015, 5:59 PM

## 2015-02-24 DIAGNOSIS — K921 Melena: Secondary | ICD-10-CM

## 2015-02-24 DIAGNOSIS — G43001 Migraine without aura, not intractable, with status migrainosus: Secondary | ICD-10-CM

## 2015-02-24 LAB — CBC
HEMATOCRIT: 33.3 % — AB (ref 36.0–46.0)
Hemoglobin: 11.4 g/dL — ABNORMAL LOW (ref 12.0–15.0)
MCH: 30.2 pg (ref 26.0–34.0)
MCHC: 34.2 g/dL (ref 30.0–36.0)
MCV: 88.1 fL (ref 78.0–100.0)
Platelets: 184 10*3/uL (ref 150–400)
RBC: 3.78 MIL/uL — ABNORMAL LOW (ref 3.87–5.11)
RDW: 14.2 % (ref 11.5–15.5)
WBC: 6.1 10*3/uL (ref 4.0–10.5)

## 2015-02-24 LAB — COMPREHENSIVE METABOLIC PANEL
ALBUMIN: 3.6 g/dL (ref 3.5–5.0)
ALK PHOS: 42 U/L (ref 38–126)
ALT: 15 U/L (ref 14–54)
AST: 18 U/L (ref 15–41)
Anion gap: 7 (ref 5–15)
BILIRUBIN TOTAL: 0.8 mg/dL (ref 0.3–1.2)
BUN: 8 mg/dL (ref 6–20)
CO2: 25 mmol/L (ref 22–32)
Calcium: 8.7 mg/dL — ABNORMAL LOW (ref 8.9–10.3)
Chloride: 108 mmol/L (ref 101–111)
Creatinine, Ser: 0.87 mg/dL (ref 0.44–1.00)
GFR calc Af Amer: >60 mL/min (ref >60)
GFR calc non Af Amer: >60 mL/min (ref >60)
GLUCOSE: 92 mg/dL (ref 65–99)
POTASSIUM: 3.6 mmol/L (ref 3.5–5.1)
Sodium: 140 mmol/L (ref 135–145)
TOTAL PROTEIN: 6.7 g/dL (ref 6.5–8.1)

## 2015-02-24 MED ORDER — KETOROLAC TROMETHAMINE 30 MG/ML IJ SOLN
25.0000 mg | Freq: Once | INTRAMUSCULAR | Status: AC
  Administered 2015-02-24: 25 mg via INTRAVENOUS
  Filled 2015-02-24: qty 1

## 2015-02-24 MED ORDER — HYDRALAZINE HCL 20 MG/ML IJ SOLN
10.0000 mg | INTRAMUSCULAR | Status: DC | PRN
  Administered 2015-02-24 – 2015-02-25 (×4): 10 mg via INTRAVENOUS
  Filled 2015-02-24 (×4): qty 1

## 2015-02-24 MED ORDER — LISINOPRIL 5 MG PO TABS
5.0000 mg | ORAL_TABLET | Freq: Every day | ORAL | Status: DC
  Administered 2015-02-24 – 2015-02-25 (×2): 5 mg via ORAL
  Filled 2015-02-24 (×2): qty 1

## 2015-02-24 MED ORDER — HYDRALAZINE HCL 20 MG/ML IJ SOLN
5.0000 mg | INTRAMUSCULAR | Status: DC | PRN
  Administered 2015-02-24: 5 mg via INTRAVENOUS
  Filled 2015-02-24: qty 1

## 2015-02-24 MED ORDER — DIPHENHYDRAMINE HCL 50 MG/ML IJ SOLN
25.0000 mg | Freq: Once | INTRAMUSCULAR | Status: AC
  Administered 2015-02-24: 25 mg via INTRAVENOUS
  Filled 2015-02-24: qty 1

## 2015-02-24 MED ORDER — METOCLOPRAMIDE HCL 5 MG/ML IJ SOLN
5.0000 mg | Freq: Once | INTRAMUSCULAR | Status: AC
  Administered 2015-02-24: 5 mg via INTRAVENOUS
  Filled 2015-02-24: qty 2

## 2015-02-24 NOTE — Consult Note (Signed)
Referring Provider: Dr. Wyline Copas Primary Care Physician:  No PCP Per Patient Primary Gastroenterologist:  Althia Forts  Reason for Consultation:  Melena  HPI: Tina Arroyo is a 49 y.o. female seen for a consult due to black stools that started Monday and had one loose black stool that day and one loose black stool Tuesday. No black stools yesterday. Reports having one black "hard" stool just now. Denies hematemesis, vomiting, hematochezia. Reports having epigastric pain several weeks ago but denies any abdominal pain this week. Denies dizziness. Denies heartburn. +N. Reports remote history of peptic ulcer disease seen on an EGD 10 years ago. Took Ibuprofen for 3 days last week. Denies alcohol. Hgb 11.7.  Past Medical History  Diagnosis Date  . Hypertension   . Migraines     Past Surgical History  Procedure Laterality Date  . Abdominal hysterectomy    . Knee      left "ligament got tore up" surgery  . Tubal ligation      Prior to Admission medications   Medication Sig Start Date End Date Taking? Authorizing Provider  ibuprofen (ADVIL,MOTRIN) 200 MG tablet Take 400 mg by mouth every 6 (six) hours as needed for headache, mild pain or moderate pain.    Yes Historical Provider, MD  amLODipine (NORVASC) 5 MG tablet Take 1 tablet (5 mg total) by mouth daily. Patient not taking: Reported on 02/23/2015 06/02/14   Ezequiel Essex, MD    Scheduled Meds: . amLODipine  5 mg Oral Daily  . feeding supplement  1 Container Oral TID BM  . Influenza vac split quadrivalent PF  0.5 mL Intramuscular Tomorrow-1000  . pantoprazole (PROTONIX) IV  40 mg Intravenous Q12H   Continuous Infusions: . sodium chloride 75 mL/hr at 02/24/15 0636   PRN Meds:.ondansetron **OR** ondansetron (ZOFRAN) IV  Allergies as of 02/23/2015  . (No Known Allergies)    History reviewed. No pertinent family history.  Social History   Social History  . Marital Status: Single    Spouse Name: N/A  . Number of Children: N/A   . Years of Education: N/A   Occupational History  . Not on file.   Social History Main Topics  . Smoking status: Never Smoker   . Smokeless tobacco: Not on file  . Alcohol Use: No  . Drug Use: No  . Sexual Activity: Not on file   Other Topics Concern  . Not on file   Social History Narrative    Review of Systems: All negative except as stated above in HPI.  Physical Exam: Vital signs: Filed Vitals:   02/23/15 2056 02/24/15 0528  BP: 135/78 142/87  Pulse: 76 64  Temp: 98.7 F (37.1 C) 98.5 F (36.9 C)  Resp: 18 18   Last BM Date: 02/23/15 General:   Alert,  Well-developed, well-nourished, pleasant and cooperative in NAD Head: atraumatic Eyes: anicteric sclera ENT: oropharynx clear Neck: supple, nontender Lungs:  Clear throughout to auscultation.   No wheezes, crackles, or rhonchi. No acute distress. Heart:  Regular rate and rhythm; no murmurs, clicks, rubs,  or gallops. Abdomen: minimal epigastric tenderness with minimal guarding, soft, nondistended, +BS  Rectal:  Deferred Ext: no edema Skin: no rash  GI:  Lab Results:  Recent Labs  02/23/15 1401 02/24/15 0520  WBC 6.8 6.1  HGB 11.7* 11.4*  HCT 34.1* 33.3*  PLT 177 184   BMET  Recent Labs  02/23/15 1401 02/24/15 0520  NA 140 140  K 3.6 3.6  CL 106 108  CO2 26  25  GLUCOSE 126* 92  BUN 12 8  CREATININE 1.02* 0.87  CALCIUM 8.8* 8.7*   LFT  Recent Labs  02/24/15 0520  PROT 6.7  ALBUMIN 3.6  AST 18  ALT 15  ALKPHOS 42  BILITOT 0.8   PT/INR No results for input(s): LABPROT, INR in the last 72 hours.   Studies/Results: No results found.  Impression/Plan: 49 yo with onset of melena this week without vomiting or abdominal pain. No evidence of ongoing bleeding with Hgb stable. Suspect a peptic ulcer bleed vs gastritis. EGD tomorrow morning. PPI IV Q 12 hours. Soft diet ok until dinner and then change to clear liquid diet for dinner. NPO p MN. Supportive care. Follow H/Hs.       Balfour C.  02/24/2015, 9:45 AM  Pager [phone]  If no answer or after 5 PM call [phone]

## 2015-02-24 NOTE — Progress Notes (Signed)
[facility] PROGRESS NOTE  Tina Arroyo [id number] DOB: Sep 01, 1965 DOA: 02/23/2015 PCP: No PCP Per Patient  HPI/Brief narrative 49 y.o. female with a hx of migraines on NSAIDs and HTN who presents wtih significant amounts of dark tarry stools x 1-2 days prior to admission. Pt also reports mild epigastric pain. In the ED, pt was found to have hgb of 11.7 (last was 16.7 one year prior). Patient was started on protonix. GI consulted and hospitalist consulted for consideration for admission.  Assessment/Plan:  1. Upper GI bleeding 1. Hgb remains stable thus far 2. Pt's stool was heme positive 3. GI consulted, appreciate input. Recs for EGD on 11/25 4. Will continue IV protonix 5. Follow CBC 2. HTN 1. BP poorly controlled despite resuming amlodipine 2. Will add lisinopril 5mg . No documented allergies to ACEI and renal function normal 3. Add hydralazine PRN 3. Hx migraines 1. Stable 2. Avoid NSAIDS for now secondary to above 4. DVT prophylaxis 1. SCD's  Code Status: Full Family Communication: Pt in room Disposition Plan: Home when GI bleed resolved and cleared by GI   Consultants:  GI  Procedures:    Antibiotics: Anti-infectives    None      HPI/Subjective: Denies abd pain. No stools this AM  Objective: Filed Vitals:   02/24/15 1049 02/24/15 1215 02/24/15 1303 02/24/15 1402  BP: 178/97 166/86 168/97 172/98  Pulse:    85  Temp:    98.6 F (37 C)  TempSrc:    Oral  Resp:    18  SpO2:    100%    Intake/Output Summary (Last 24 hours) at 02/24/15 1432 Last data filed at 02/24/15 1415  Gross per 24 hour  Intake 543.75 ml  Output      0 ml  Net 543.75 ml   There were no vitals filed for this visit.  Exam:   General:  Awake, in nad  Cardiovascular: regular, s1, s2  Respiratory: normal resp effort, no wheezing  Abdomen: soft,nondistended  Musculoskeletal: perfused, no clubbing   Data Reviewed: Basic Metabolic Panel:  Recent Labs Lab  02/23/15 1401 02/24/15 0520  NA 140 140  K 3.6 3.6  CL 106 108  CO2 26 25  GLUCOSE 126* 92  BUN 12 8  CREATININE 1.02* 0.87  CALCIUM 8.8* 8.7*   Liver Function Tests:  Recent Labs Lab 02/23/15 1401 02/24/15 0520  AST 21 18  ALT 17 15  ALKPHOS 47 42  BILITOT 0.3 0.8  PROT 7.3 6.7  ALBUMIN 4.1 3.6   No results for input(s): LIPASE, AMYLASE in the last 168 hours. No results for input(s): AMMONIA in the last 168 hours. CBC:  Recent Labs Lab 02/23/15 1401 02/24/15 0520  WBC 6.8 6.1  HGB 11.7* 11.4*  HCT 34.1* 33.3*  MCV 88.6 88.1  PLT 177 184   Cardiac Enzymes: No results for input(s): CKTOTAL, CKMB, CKMBINDEX, TROPONINI in the last 168 hours. BNP (last 3 results) No results for input(s): BNP in the last 8760 hours.  ProBNP (last 3 results) No results for input(s): PROBNP in the last 8760 hours.  CBG: No results for input(s): GLUCAP in the last 168 hours.  No results found for this or any previous visit (from the past 240 hour(s)).   Studies: No results found.  Scheduled Meds: . amLODipine  5 mg Oral Daily  . feeding supplement  1 Container Oral TID BM  . Influenza vac split quadrivalent PF  0.5 mL Intramuscular Tomorrow-1000  . lisinopril  5 mg  Oral Daily  . pantoprazole (PROTONIX) IV  40 mg Intravenous Q12H   Continuous Infusions: . sodium chloride 75 mL/hr at 02/24/15 1415    Principal Problem:   Upper GI bleed Active Problems:   HTN (hypertension)   Migraine    Tina Arroyo K  [facility] Pager [phone]. If 7PM-7AM, please contact night-coverage at [email or web address], password [facility] 02/24/2015, 2:32 PM

## 2015-02-25 ENCOUNTER — Encounter (HOSPITAL_COMMUNITY)
Admission: EM | Disposition: A | Discharge status: Home / Self Care | Payer: Self-pay | Source: Home / Self Care | Attending: Emergency Medicine

## 2015-02-25 ENCOUNTER — Encounter (HOSPITAL_COMMUNITY): Payer: Self-pay

## 2015-02-25 DIAGNOSIS — K921 Melena: Secondary | ICD-10-CM | POA: Diagnosis present

## 2015-02-25 DIAGNOSIS — I10 Essential (primary) hypertension: Secondary | ICD-10-CM

## 2015-02-25 HISTORY — PX: ESOPHAGOGASTRODUODENOSCOPY: SHX5428

## 2015-02-25 LAB — BASIC METABOLIC PANEL
ANION GAP: 9 (ref 5–15)
BUN: 8 mg/dL (ref 6–20)
CHLORIDE: 108 mmol/L (ref 101–111)
CO2: 23 mmol/L (ref 22–32)
Calcium: 9.3 mg/dL (ref 8.9–10.3)
Creatinine, Ser: 0.84 mg/dL (ref 0.44–1.00)
GFR calc Af Amer: >60 mL/min (ref >60)
GLUCOSE: 100 mg/dL — AB (ref 65–99)
POTASSIUM: 3.8 mmol/L (ref 3.5–5.1)
Sodium: 140 mmol/L (ref 135–145)

## 2015-02-25 LAB — CBC
HEMATOCRIT: 37.1 % (ref 36.0–46.0)
HEMOGLOBIN: 12.7 g/dL (ref 12.0–15.0)
MCH: 30.6 pg (ref 26.0–34.0)
MCHC: 34.2 g/dL (ref 30.0–36.0)
MCV: 89.4 fL (ref 78.0–100.0)
Platelets: 207 10*3/uL (ref 150–400)
RBC: 4.15 MIL/uL (ref 3.87–5.11)
RDW: 14.2 % (ref 11.5–15.5)
WBC: 7.8 10*3/uL (ref 4.0–10.5)

## 2015-02-25 SURGERY — EGD (ESOPHAGOGASTRODUODENOSCOPY)
Anesthesia: Moderate Sedation

## 2015-02-25 MED ORDER — CLONIDINE HCL 0.2 MG PO TABS
0.2000 mg | ORAL_TABLET | Freq: Once | ORAL | Status: AC
  Administered 2015-02-25: 0.2 mg via ORAL
  Filled 2015-02-25: qty 1

## 2015-02-25 MED ORDER — MIDAZOLAM HCL 5 MG/ML IJ SOLN
INTRAMUSCULAR | Status: AC
  Filled 2015-02-25: qty 2

## 2015-02-25 MED ORDER — METOCLOPRAMIDE HCL 5 MG/ML IJ SOLN
5.0000 mg | Freq: Once | INTRAMUSCULAR | Status: AC
  Administered 2015-02-25: 5 mg via INTRAVENOUS
  Filled 2015-02-25: qty 2

## 2015-02-25 MED ORDER — MIDAZOLAM HCL 10 MG/2ML IJ SOLN
INTRAMUSCULAR | Status: DC | PRN
  Administered 2015-02-25: 2 mg via INTRAVENOUS
  Administered 2015-02-25 (×3): 1 mg via INTRAVENOUS

## 2015-02-25 MED ORDER — LISINOPRIL 5 MG PO TABS: 5.0000 mg | ORAL_TABLET | Freq: Every day | ORAL | Status: DC

## 2015-02-25 MED ORDER — AMLODIPINE BESYLATE 5 MG PO TABS: 5.0000 mg | ORAL_TABLET | Freq: Every day | ORAL | Status: DC

## 2015-02-25 MED ORDER — MINOXIDIL 2.5 MG PO TABS
5.0000 mg | ORAL_TABLET | Freq: Two times a day (BID) | ORAL | Status: DC
  Administered 2015-02-25: 5 mg via ORAL
  Filled 2015-02-25 (×3): qty 2

## 2015-02-25 MED ORDER — FENTANYL CITRATE (PF) 100 MCG/2ML IJ SOLN
INTRAMUSCULAR | Status: AC
  Filled 2015-02-25: qty 2

## 2015-02-25 MED ORDER — KETOROLAC TROMETHAMINE 15 MG/ML IJ SOLN
15.0000 mg | Freq: Once | INTRAMUSCULAR | Status: AC
  Administered 2015-02-25: 15 mg via INTRAVENOUS
  Filled 2015-02-25: qty 1

## 2015-02-25 MED ORDER — MORPHINE SULFATE (PF) 2 MG/ML IV SOLN
1.0000 mg | Freq: Once | INTRAVENOUS | Status: AC
  Administered 2015-02-25: 1 mg via INTRAVENOUS
  Filled 2015-02-25: qty 1

## 2015-02-25 MED ORDER — DIPHENHYDRAMINE HCL 50 MG/ML IJ SOLN
25.0000 mg | Freq: Once | INTRAMUSCULAR | Status: AC
  Administered 2015-02-25: 25 mg via INTRAVENOUS
  Filled 2015-02-25: qty 1

## 2015-02-25 MED ORDER — AMLODIPINE BESYLATE 10 MG PO TABS: 10.0000 mg | ORAL_TABLET | Freq: Every day | ORAL | Status: DC

## 2015-02-25 MED ORDER — FENTANYL CITRATE (PF) 100 MCG/2ML IJ SOLN
INTRAMUSCULAR | Status: DC | PRN
  Administered 2015-02-25 (×2): 25 ug via INTRAVENOUS

## 2015-02-25 MED ORDER — PANTOPRAZOLE SODIUM 40 MG PO TBEC: 40.0000 mg | DELAYED_RELEASE_TABLET | Freq: Every day | ORAL | Status: DC

## 2015-02-25 MED ORDER — DIPHENHYDRAMINE HCL 50 MG/ML IJ SOLN
INTRAMUSCULAR | Status: AC
  Filled 2015-02-25: qty 1

## 2015-02-25 NOTE — Op Note (Signed)
[facility] [place], [address]   ENDOSCOPY PROCEDURE REPORT  PATIENT: Tina, Arroyo  MR#: [id number] BIRTHDATE: 21-Mar-1966 , 49  yrs. old GENDER: female ENDOSCOPIST: Wilford Corner, MD REFERRED BY:  hospital team PROCEDURE DATE:  Mar 11, 2015 PROCEDURE:  EGD, diagnostic ASA CLASS:     Class II INDICATIONS:  melena. MEDICATIONS: Fentanyl 50 mcg IV and Versed 5 mg IV TOPICAL ANESTHETIC: Cetacaine Spray  DESCRIPTION OF PROCEDURE: After the risks benefits and alternatives of the procedure were thoroughly explained, informed consent was obtained.  The Pentax Gastroscope [id number] endoscope was introduced through the mouth and advanced to the second portion of the duodenum , Without limitations.  The instrument was slowly withdrawn as the mucosa was fully examined. Estimated blood loss is zero unless otherwise noted in this procedure report.    Esophagus normal. GEJ normal and 40 cm from the incisors. Minimal edema and erythema in distal stomach c/w antral gastritis. No other gastric mucosal abnormalities seen. Duodenal bulb and 2nd portion of the duodenum normal in appearance.       Retroflexed views revealed no abnormalities. Clear bilious fluid seen in examined duodenum and stomach. No bleeding or blood products seen. The scope was then withdrawn from the patient and the procedure completed.  COMPLICATIONS: There were no immediate complications.  ENDOSCOPIC IMPRESSION:     Minimal antral gastritis o/w normal EGD  RECOMMENDATIONS:     Advance diet; PPI PO QD   eSigned:  Wilford Corner, MD 03-11-2015 1:44 PM    CC:  CPT CODES: ICD CODES:  The ICD and CPT codes recommended by this software are interpretations from the data that the clinical staff has captured with the software.  The verification of the translation of this report to the ICD and CPT codes and modifiers is the sole responsibility of the health care institution and  practicing physician where this report was generated.  [employer] will not be held responsible for the validity of the ICD and CPT codes included on this report.  AMA assumes no liability for data contained or not contained herein. CPT is a Designer, television/film set of the [employer].  PATIENT NAME:  Tina, Arroyo MR#: [id number]

## 2015-02-25 NOTE — Discharge Summary (Addendum)
Physician Discharge Summary  Tina Arroyo [id number] DOB: [birth year] DOA: 02/23/2015  PCP: No PCP Per Patient  Admit date: 02/23/2015 Discharge date: 02/25/2015  Time spent: 20 minutes  Recommendations for Outpatient Follow-up:  1. Follow up with PCP in 2-3 weeks 2. Follow up with Dr. Michail Sermon in 6-8 weeks   Discharge Diagnoses:  Principal Problem:   Upper GI bleed Active Problems:   HTN (hypertension)   Migraine   Melena   Uncontrolled stage 2 hypertension   Discharge Condition: Stable  Diet recommendation: Regular  There were no vitals filed for this visit.  History of present illness:  Please review dictated H and P from 49 y.o. female with a hx of migraines on NSAIDs and HTN who presents wtih significant amounts of dark tarry stools x 1-2 days prior to admission. Pt also reports mild epigastric pain. In the ED, pt was found to have hgb of 11.7 (last was 16.7 one year prior). Patient was started on protonix. GI consulted and hospitalist consulted for consideration for admission.  Hospital Course:  1. Upper GI bleeding 1. Hgb remained stable this admission 2. Pt's stool was heme positive on admit 3. GI was consulted and patient underwent EGD on 11/25 with findings of gastritis 4. GI recommendations to continue on Qday PPI and to follow up in GI office in 6-8 weeks 2. HTN 1. BP noted to be poorly controlled despite resuming amlodipine from home med list 2. Added lisinopril 5mg . No documented allergies to ACEI and renal function remained normal 3. Patient was also continued on hydralazine PRN while admitted 4. BP meds to be adjusted per PCP on follow up 3. Hx migraines 1. Stable 2. Avoid NSAIDS if possible secondary to above 4. DVT prophylaxis 1. SCD's while admitted  Procedures:  EGD 11/25 - gastritis  Consultations:  GI  Discharge Exam: Filed Vitals:   02/25/15 1340 02/25/15 1345 02/25/15 1402 02/25/15 1634  BP: 205/116 189/109 159/94 165/104   Pulse: 94 91 93   Temp:   98.1 F (36.7 C)   TempSrc:   Oral   Resp: 20 19 20    Height:      SpO2: 100% 99% 100%     General: Awake, in nad Cardiovascular: regular, s1, s2 Respiratory: normal resp effort, no wheezing  Discharge Instructions     Medication List    TAKE these medications        amLODipine 10 MG tablet  Commonly known as:  NORVASC  Take 1 tablet (10 mg total) by mouth daily.     ibuprofen 200 MG tablet  Commonly known as:  ADVIL,MOTRIN  Take 400 mg by mouth every 6 (six) hours as needed for headache, mild pain or moderate pain.     lisinopril 5 MG tablet  Commonly known as:  PRINIVIL,ZESTRIL  Take 1 tablet (5 mg total) by mouth daily.     pantoprazole 40 MG tablet  Commonly known as:  PROTONIX  Take 1 tablet (40 mg total) by mouth daily.       No Known Allergies Follow-up Information    Follow up with Follow up with PCP in 2-3 weeks In 2 weeks.   Why:  Hospital follow up      Call [facility].   Why:  Hospital follow up   Contact information:   [address]  [place] [phone]       The results of significant diagnostics from this hospitalization (including imaging, microbiology, ancillary  and laboratory) are listed below for reference.    Significant Diagnostic Studies: No results found.  Microbiology: No results found for this or any previous visit (from the past 240 hour(s)).   Labs: Basic Metabolic Panel:  Recent Labs Lab 02/23/15 1401 02/24/15 0520 02/25/15 0541  NA 140 140 140  K 3.6 3.6 3.8  CL 106 108 108  CO2 26 25 23   GLUCOSE 126* 92 100*  BUN 12 8 8   CREATININE 1.02* 0.87 0.84  CALCIUM 8.8* 8.7* 9.3   Liver Function Tests:  Recent Labs Lab 02/23/15 1401 02/24/15 0520  AST 21 18  ALT 17 15  ALKPHOS 47 42  BILITOT 0.3 0.8  PROT 7.3 6.7  ALBUMIN 4.1 3.6   No results for input(s): LIPASE, AMYLASE in the last 168 hours. No results for  input(s): AMMONIA in the last 168 hours. CBC:  Recent Labs Lab 02/23/15 1401 02/24/15 0520 02/25/15 0541  WBC 6.8 6.1 7.8  HGB 11.7* 11.4* 12.7  HCT 34.1* 33.3* 37.1  MCV 88.6 88.1 89.4  PLT 177 184 207   Cardiac Enzymes: No results for input(s): CKTOTAL, CKMB, CKMBINDEX, TROPONINI in the last 168 hours. BNP: BNP (last 3 results) No results for input(s): BNP in the last 8760 hours.  ProBNP (last 3 results) No results for input(s): PROBNP in the last 8760 hours.  CBG: No results for input(s): GLUCAP in the last 168 hours.    Signed:  Margia Wiesen, Orpah Melter  [facility] 02/25/2015, 4:38 PM

## 2015-02-25 NOTE — Progress Notes (Signed)
Notified physician that patient vomited.  Physician came to room.  Discharge cancelled and physician putting in other orders.

## 2015-02-25 NOTE — Treatment Plan (Signed)
Patient's sbp remains in the upper [id number] with diastolic Bp in the [id number] and pt with headache and nausea. Will give one dose of clonidine, change lisinopril to minoxidil for better bp control. Will also give toradol, reglan, benadryl for migraine headache. D/c discharge order and monitor overnight

## 2015-02-25 NOTE — Interval H&P Note (Signed)
History and Physical Interval Note:  02/25/2015 1:01 PM  Tina Arroyo  has presented today for surgery, with the diagnosis of GI Bleed  The various methods of treatment have been discussed with the patient and family. After consideration of risks, benefits and other options for treatment, the patient has consented to  Procedure(s): ESOPHAGOGASTRODUODENOSCOPY (EGD) (N/A) as a surgical intervention .  The patient's history has been reviewed, patient examined, no change in status, stable for surgery.  I have reviewed the patient's chart and labs.  Questions were answered to the patient's satisfaction.     [place] C.

## 2015-02-25 NOTE — H&P (View-Only) (Signed)
Referring Provider: Dr. Wyline Copas Primary Care Physician:  No PCP Per Patient Primary Gastroenterologist:  Althia Forts  Reason for Consultation:  Melena  HPI: [name] is a 49 y.o. female seen for a consult due to black stools that started Monday and had one loose black stool that day and one loose black stool Tuesday. No black stools yesterday. Reports having one black "hard" stool just now. Denies hematemesis, vomiting, hematochezia. Reports having epigastric pain several weeks ago but denies any abdominal pain this week. Denies dizziness. Denies heartburn. +N. Reports remote history of peptic ulcer disease seen on an EGD 10 years ago. Took Ibuprofen for 3 days last week. Denies alcohol. Hgb 11.7.  Past Medical History  Diagnosis Date  . Hypertension   . Migraines     Past Surgical History  Procedure Laterality Date  . Abdominal hysterectomy    . Knee      left "ligament got tore up" surgery  . Tubal ligation      Prior to Admission medications   Medication Sig Start Date End Date Taking? Authorizing Provider  ibuprofen (ADVIL,MOTRIN) 200 MG tablet Take 400 mg by mouth every 6 (six) hours as needed for headache, mild pain or moderate pain.    Yes Historical Provider, MD  amLODipine (NORVASC) 5 MG tablet Take 1 tablet (5 mg total) by mouth daily. Patient not taking: Reported on 02/23/2015 06/02/14   Tina Essex, MD    Scheduled Meds: . amLODipine  5 mg Oral Daily  . feeding supplement  1 Container Oral TID BM  . Influenza vac split quadrivalent PF  0.5 mL Intramuscular Tomorrow-1000  . pantoprazole (PROTONIX) IV  40 mg Intravenous Q12H   Continuous Infusions: . sodium chloride 75 mL/hr at 02/24/15 0636   PRN Meds:.ondansetron **OR** ondansetron (ZOFRAN) IV  Allergies as of 02/23/2015  . (No Known Allergies)    History reviewed. No pertinent family history.  Social History   Social History  . Marital Status: Single    Spouse Name: N/A  . Number of Children: N/A   . Years of Education: N/A   Occupational History  . Not on file.   Social History Main Topics  . Smoking status: Never Smoker   . Smokeless tobacco: Not on file  . Alcohol Use: No  . Drug Use: No  . Sexual Activity: Not on file   Other Topics Concern  . Not on file   Social History Narrative    Review of Systems: All negative except as stated above in HPI.  Physical Exam: Vital signs: Filed Vitals:   02/23/15 2056 02/24/15 0528  BP: 135/78 142/87  Pulse: 76 64  Temp: 98.7 F (37.1 C) 98.5 F (36.9 C)  Resp: 18 18   Last BM Date: 02/23/15 General:   Alert,  Well-developed, well-nourished, pleasant and cooperative in NAD Head: atraumatic Eyes: anicteric sclera ENT: oropharynx clear Neck: supple, nontender Lungs:  Clear throughout to auscultation.   No wheezes, crackles, or rhonchi. No acute distress. Heart:  Regular rate and rhythm; no murmurs, clicks, rubs,  or gallops. Abdomen: minimal epigastric tenderness with minimal guarding, soft, nondistended, +BS  Rectal:  Deferred Ext: no edema Skin: no rash  GI:  Lab Results:  Recent Labs  02/23/15 1401 02/24/15 0520  WBC 6.8 6.1  HGB 11.7* 11.4*  HCT 34.1* 33.3*  PLT 177 184   BMET  Recent Labs  02/23/15 1401 02/24/15 0520  NA 140 140  K 3.6 3.6  CL 106 108  CO2 26  25  GLUCOSE 126* 92  BUN 12 8  CREATININE 1.02* 0.87  CALCIUM 8.8* 8.7*   LFT  Recent Labs  02/24/15 0520  PROT 6.7  ALBUMIN 3.6  AST 18  ALT 15  ALKPHOS 42  BILITOT 0.8   PT/INR No results for input(s): LABPROT, INR in the last 72 hours.   Studies/Results: No results found.  Impression/Plan: 49 yo with onset of melena this week without vomiting or abdominal pain. No evidence of ongoing bleeding with Hgb stable. Suspect a peptic ulcer bleed vs gastritis. EGD tomorrow morning. PPI IV Q 12 hours. Soft diet ok until dinner and then change to clear liquid diet for dinner. NPO p MN. Supportive care. Follow H/Hs.       [place] C.  02/24/2015, 9:45 AM  Pager [phone]  If no answer or after 5 PM call [phone]

## 2015-02-25 NOTE — Progress Notes (Signed)
Initial Nutrition Assessment  INTERVENTION:   -Patient needs Height/Weight for this admission to be obtained. -Diet advancement per MD -Once diet is advanced, recommend Ensure Enlive po BID, each supplement provides 350 kcal and 20 grams of protein -RD to continue to monitor  NUTRITION DIAGNOSIS:   Inadequate oral intake related to inability to eat as evidenced by NPO status.  GOAL:   Patient will meet greater than or equal to 90% of their needs  MONITOR:   Diet advancement, Labs, Weight trends, Skin, I & O's  REASON FOR ASSESSMENT:   Malnutrition Screening Tool    ASSESSMENT:   49 y.o. female with a hx of migraines on NSAIDs and HTN who presents wtih significant amounts of dark tarry stools x 1-2 days prior to admission. Pt also reports mild epigastric pain.  Pt reports fair appetite PTA, unable to state how long her appetite has been lower. Pt states she will eat half of her meals. UBW is 125-140 lb. Last weight taken in March 2016. Needs current ht/wt to complete assessment. Pt reports not noticing any weight loss. She feels hungry today but is NPO for a procedure. She reports possible discharge today. RD to monitor plan. Patient requests Ensure supplements if her diet is advanced.  Nutrition focused physical exam shows no sign of depletion of muscle mass or body fat.  Labs reviewed.  Diet Order:  Diet NPO time specified  Skin:  Reviewed, no issues  Last BM:  11/24  Height:   Ht Readings from Last 1 Encounters:  02/25/15 5\' 3"  (1.6 m)    Weight:   Wt Readings from Last 1 Encounters:  06/02/14 135 lb (61.236 kg)    Ideal Body Weight:   (unable to calculate)  BMI:  There is no weight on file to calculate BMI.  Estimated Nutritional Needs:   Kcal:  unable to calculate  Protein:  "  Fluid:  "  EDUCATION NEEDS:   No education needs identified at this time  Clayton Bibles, MS, RD, LDN Pager: [phone] After Hours Pager: [phone]

## 2015-02-25 NOTE — Care Management Note (Signed)
Case Management Note  Patient Details  Name: Jaylinn Ciccarelli MRN: [id number] Date of Birth: 1965/05/29  Subjective/Objective:   49 y/o f admitted w/UGIB.From home. Received referral for pcp,no health insurance,& meds asst.  [facility]-closed today-patient will call on Monday for appt(voiced understanding), Health insurance info given-explained that [facility] will also asst w/getting health insurnace, & meds.Provided w[facility] med list.She states she can afford meds.                 Action/Plan:d/c home no further d/c needs or orders.   Expected Discharge Date:   (unknown)               Expected Discharge Plan:  Home/Self Care  In-House Referral:     Discharge planning Services  CM Consult, [facility]  Post Acute Care Choice:    Choice offered to:     DME Arranged:    DME Agency:     HH Arranged:    [place] Agency:     Status of Service:  Completed, signed off  Medicare Important Message Given:    Date Medicare IM Given:    Medicare IM give by:    Date Additional Medicare IM Given:    Additional Medicare Important Message give by:     If discussed at [place] of Stay Meetings, dates discussed:    Additional Comments:  Dessa Phi, RN 02/25/2015, 2:47 PM

## 2015-02-25 NOTE — Brief Op Note (Signed)
Minimal antral gastritis otherwise normal EGD. Start regular diet. Ok to d/c home today. F/U with me in 6-8 weeks (call office [phone] to arrange).

## 2015-02-26 LAB — CBC
HCT: 35 % — ABNORMAL LOW (ref 36.0–46.0)
Hemoglobin: 12 g/dL (ref 12.0–15.0)
MCH: 30.6 pg (ref 26.0–34.0)
MCHC: 34.3 g/dL (ref 30.0–36.0)
MCV: 89.3 fL (ref 78.0–100.0)
PLATELETS: 201 10*3/uL (ref 150–400)
RBC: 3.92 MIL/uL (ref 3.87–5.11)
RDW: 14.5 % (ref 11.5–15.5)
WBC: 10 10*3/uL (ref 4.0–10.5)

## 2015-02-26 LAB — BASIC METABOLIC PANEL
ANION GAP: 7 (ref 5–15)
BUN: 12 mg/dL (ref 6–20)
CO2: 26 mmol/L (ref 22–32)
Calcium: 9.2 mg/dL (ref 8.9–10.3)
Chloride: 110 mmol/L (ref 101–111)
Creatinine, Ser: 1.01 mg/dL — ABNORMAL HIGH (ref 0.44–1.00)
GFR calc Af Amer: >60 mL/min (ref >60)
Glucose, Bld: 103 mg/dL — ABNORMAL HIGH (ref 65–99)
POTASSIUM: 3.4 mmol/L — AB (ref 3.5–5.1)
SODIUM: 143 mmol/L (ref 135–145)

## 2015-02-26 MED ORDER — METOCLOPRAMIDE HCL 5 MG/ML IJ SOLN
5.0000 mg | Freq: Once | INTRAMUSCULAR | Status: AC
  Administered 2015-02-26: 5 mg via INTRAVENOUS
  Filled 2015-02-26: qty 2

## 2015-02-26 MED ORDER — ONDANSETRON HCL 4 MG PO TABS: 4.0000 mg | ORAL_TABLET | Freq: Three times a day (TID) | ORAL | Status: DC | PRN

## 2015-02-26 MED ORDER — DIPHENHYDRAMINE HCL 50 MG/ML IJ SOLN
25.0000 mg | Freq: Once | INTRAMUSCULAR | Status: AC
  Administered 2015-02-26: 25 mg via INTRAVENOUS
  Filled 2015-02-26: qty 1

## 2015-02-26 MED ORDER — POTASSIUM CHLORIDE CRYS ER 20 MEQ PO TBCR
40.0000 meq | EXTENDED_RELEASE_TABLET | Freq: Once | ORAL | Status: AC
  Administered 2015-02-26: 40 meq via ORAL
  Filled 2015-02-26: qty 2

## 2015-02-26 MED ORDER — KETOROLAC TROMETHAMINE 15 MG/ML IJ SOLN
15.0000 mg | Freq: Once | INTRAMUSCULAR | Status: AC
  Administered 2015-02-26: 15 mg via INTRAVENOUS
  Filled 2015-02-26: qty 1

## 2015-02-26 MED ORDER — ACETAMINOPHEN 325 MG PO TABS
650.0000 mg | ORAL_TABLET | Freq: Four times a day (QID) | ORAL | Status: DC | PRN
  Administered 2015-02-26: 650 mg via ORAL
  Filled 2015-02-26: qty 2

## 2015-02-26 MED ORDER — MINOXIDIL 2.5 MG PO TABS: 2.5000 mg | ORAL_TABLET | Freq: Two times a day (BID) | ORAL | Status: DC

## 2015-02-26 MED ORDER — MINOXIDIL 2.5 MG PO TABS
2.5000 mg | ORAL_TABLET | Freq: Two times a day (BID) | ORAL | Status: DC
  Administered 2015-02-26: 2.5 mg via ORAL
  Filled 2015-02-26 (×2): qty 1

## 2015-02-26 MED ORDER — AMLODIPINE BESYLATE 5 MG PO TABS: 5.0000 mg | ORAL_TABLET | Freq: Every day | ORAL | Status: DC

## 2015-02-26 MED ORDER — KETOROLAC TROMETHAMINE 10 MG PO TABS: 10.0000 mg | ORAL_TABLET | Freq: Four times a day (QID) | ORAL | Status: DC | PRN

## 2015-02-26 NOTE — Progress Notes (Signed)
[facility] PROGRESS NOTE  Tina Arroyo [id number] DOB: 12/12/1965 DOA: 02/23/2015 PCP: No PCP Per Patient  HPI/Brief narrative 49 y.o. female with a hx of migraines on NSAIDs and HTN who presents wtih significant amounts of dark tarry stools x 1-2 days prior to admission. Pt also reports mild epigastric pain. In the ED, pt was found to have hgb of 11.7 (last was 16.7 one year prior). Patient was started on protonix. GI consulted and hospitalist consulted for consideration for admission.  Assessment/Plan: 1. Upper GI bleeding 1. Hgb has remained stable this admission 2. Pt's stool was heme positive on admit 3. GI was consulted and patient underwent EGD on 11/25 with findings of gastritis 4. GI recommendations to continue on Qday PPI and to follow up in GI office in 6-8 weeks 2. HTN 1. BP initially noted to be poorly controlled despite resuming amlodipine from home med list 2. Had added lisinopril 5mg . No documented allergies to ACEI and renal function remained normal. Blood pressures remained very poorly controlled and patient was transitioned to norvasc with minoxidil with much better control 3. BP meds to be further adjusted per PCP on follow up 3. Migraines 1. Patient reported continued bouts of migraines, improved with toradol, benadryl, reglan 2. Patient instructed to limit NSAIDs 4. DVT prophylaxis 1. SCD's while admitted  Code Status: Full Family Communication: Pt in room Disposition Plan: Home today   Consultants:  GI  Procedures:    Antibiotics: Anti-infectives    None      HPI/Subjective: Complains of headache this AM associated with nausea  Objective: Filed Vitals:   02/25/15 1634 02/25/15 1711 02/25/15 2116 02/26/15 0549  BP: 165/104 167/101 103/68 133/84  Pulse:   99 84  Temp:   99 F (37.2 C) 98.3 F (36.8 C)  TempSrc:   Oral Oral  Resp:   20 20  Height:      SpO2:   99% 100%    Intake/Output Summary (Last 24 hours) at 02/26/15  [id number] Last data filed at 02/26/15 0517  Gross per 24 hour  Intake   1011 ml  Output      0 ml  Net   1011 ml   There were no vitals filed for this visit.  Exam:   General:  Awake, laying in bed, in nad  Cardiovascular: regular, s1, s2  Respiratory: normal resp effort, no wheezing  Abdomen: soft,nondistended  Musculoskeletal: perfused, no clubbing, no cyanosis  Data Reviewed: Basic Metabolic Panel:  Recent Labs Lab 02/23/15 1401 02/24/15 0520 02/25/15 0541 02/26/15 0519  NA 140 140 140 143  K 3.6 3.6 3.8 3.4*  CL 106 108 108 110  CO2 26 25 23 26   GLUCOSE 126* 92 100* 103*  BUN 12 8 8 12   CREATININE 1.02* 0.87 0.84 1.01*  CALCIUM 8.8* 8.7* 9.3 9.2   Liver Function Tests:  Recent Labs Lab 02/23/15 1401 02/24/15 0520  AST 21 18  ALT 17 15  ALKPHOS 47 42  BILITOT 0.3 0.8  PROT 7.3 6.7  ALBUMIN 4.1 3.6   No results for input(s): LIPASE, AMYLASE in the last 168 hours. No results for input(s): AMMONIA in the last 168 hours. CBC:  Recent Labs Lab 02/23/15 1401 02/24/15 0520 02/25/15 0541 02/26/15 0519  WBC 6.8 6.1 7.8 10.0  HGB 11.7* 11.4* 12.7 12.0  HCT 34.1* 33.3* 37.1 35.0*  MCV 88.6 88.1 89.4 89.3  PLT 177 184 207 201   Cardiac Enzymes: No results for input(s): CKTOTAL, CKMB, CKMBINDEX, TROPONINI  in the last 168 hours. BNP (last 3 results) No results for input(s): BNP in the last 8760 hours.  ProBNP (last 3 results) No results for input(s): PROBNP in the last 8760 hours.  CBG: No results for input(s): GLUCAP in the last 168 hours.  No results found for this or any previous visit (from the past 240 hour(s)).   Studies: No results found.  Scheduled Meds: . amLODipine  5 mg Oral Daily  . diphenhydrAMINE  25 mg Intravenous Once  . feeding supplement  1 Container Oral TID BM  . Influenza vac split quadrivalent PF  0.5 mL Intramuscular Tomorrow-1000  . ketorolac  15 mg Intravenous Once  . metoCLOPramide (REGLAN) injection  5 mg  Intravenous Once  . minoxidil  2.5 mg Oral BID  . pantoprazole (PROTONIX) IV  40 mg Intravenous Q12H  . potassium chloride  40 mEq Oral Once   Continuous Infusions: . sodium chloride 75 mL/hr at 02/26/15 [id number]    Principal Problem:   Upper GI bleed Active Problems:   HTN (hypertension)   Migraine   Melena   Uncontrolled stage 2 hypertension    Tina Arroyo K  [facility] Pager [phone]. If 7PM-7AM, please contact night-coverage at [email or web address], password [facility] 02/26/2015, 9:22 AM

## 2015-02-26 NOTE — Progress Notes (Signed)
Patient discharged.  Leaving with prescriptions.  Accompanied by family members.  A&O x4.  No complaints.  States she wants to walk to exit.  Room air.  Denies pain.

## 2015-02-28 ENCOUNTER — Encounter (HOSPITAL_COMMUNITY): Payer: Self-pay | Admitting: Gastroenterology

## 2015-03-23 ENCOUNTER — Ambulatory Visit (INDEPENDENT_AMBULATORY_CARE_PROVIDER_SITE_OTHER): Payer: Self-pay | Admitting: Family Medicine

## 2015-03-23 ENCOUNTER — Encounter: Payer: Self-pay | Admitting: Family Medicine

## 2015-03-23 VITALS — BP 134/83 | HR 86 | Temp 98.3°F | Resp 14 | Ht 62.0 in | Wt 145.0 lb

## 2015-03-23 DIAGNOSIS — Z0000 Encounter for general adult medical examination without abnormal findings: Secondary | ICD-10-CM

## 2015-03-23 DIAGNOSIS — Z23 Encounter for immunization: Secondary | ICD-10-CM

## 2015-03-23 LAB — LIPID PANEL
CHOLESTEROL: 161 mg/dL (ref 125–200)
HDL: 51 mg/dL (ref >=46)
LDL CALC: 99 mg/dL (ref <130)
TRIGLYCERIDES: 54 mg/dL (ref <150)
Total CHOL/HDL Ratio: 3.2 Ratio (ref <=5.0)
VLDL: 11 mg/dL (ref <30)

## 2015-03-23 LAB — COMPLETE METABOLIC PANEL WITH GFR
ALBUMIN: 4.2 g/dL (ref 3.6–5.1)
ALK PHOS: 55 U/L (ref 33–115)
ALT: 22 U/L (ref 6–29)
AST: 26 U/L (ref 10–35)
BUN: 9 mg/dL (ref 7–25)
CHLORIDE: 102 mmol/L (ref 98–110)
CO2: 30 mmol/L (ref 20–31)
Calcium: 9.3 mg/dL (ref 8.6–10.2)
Creat: 0.8 mg/dL (ref 0.50–1.10)
GFR, Est African American: >89 mL/min (ref >=60)
GFR, Est Non African American: 87 mL/min (ref >=60)
GLUCOSE: 93 mg/dL (ref 65–99)
POTASSIUM: 3.8 mmol/L (ref 3.5–5.3)
SODIUM: 138 mmol/L (ref 135–146)
Total Bilirubin: 0.4 mg/dL (ref 0.2–1.2)
Total Protein: 7.3 g/dL (ref 6.1–8.1)

## 2015-03-23 LAB — CBC WITH DIFFERENTIAL/PLATELET
Basophils Absolute: 0 10*3/uL (ref 0.0–0.1)
Basophils Relative: 0 % (ref 0–1)
Eosinophils Absolute: 0.2 10*3/uL (ref 0.0–0.7)
Eosinophils Relative: 3 % (ref 0–5)
HEMATOCRIT: 38.7 % (ref 36.0–46.0)
HEMOGLOBIN: 12.8 g/dL (ref 12.0–15.0)
LYMPHS ABS: 2.2 10*3/uL (ref 0.7–4.0)
LYMPHS PCT: 36 % (ref 12–46)
MCH: 30 pg (ref 26.0–34.0)
MCHC: 33.1 g/dL (ref 30.0–36.0)
MCV: 90.8 fL (ref 78.0–100.0)
MONO ABS: 0.6 10*3/uL (ref 0.1–1.0)
MONOS PCT: 10 % (ref 3–12)
MPV: 10.9 fL (ref 8.6–12.4)
NEUTROS ABS: 3.1 10*3/uL (ref 1.7–7.7)
Neutrophils Relative %: 51 % (ref 43–77)
Platelets: 231 10*3/uL (ref 150–400)
RBC: 4.26 MIL/uL (ref 3.87–5.11)
RDW: 13.8 % (ref 11.5–15.5)
WBC: 6.1 10*3/uL (ref 4.0–10.5)

## 2015-03-23 LAB — TSH: TSH: 0.927 u[IU]/mL (ref 0.350–4.500)

## 2015-03-23 MED ORDER — LISINOPRIL 5 MG PO TABS
5.0000 mg | ORAL_TABLET | Freq: Every day | ORAL | Status: DC
Start: 2015-03-23 — End: 2015-03-23

## 2015-03-23 MED ORDER — LISINOPRIL 5 MG PO TABS: 5.0000 mg | ORAL_TABLET | Freq: Every day | ORAL | Status: DC

## 2015-03-23 MED ORDER — AMLODIPINE BESYLATE 5 MG PO TABS: 5.0000 mg | ORAL_TABLET | Freq: Every day | ORAL | Status: DC

## 2015-03-23 MED ORDER — PANTOPRAZOLE SODIUM 40 MG PO TBEC: 40.0000 mg | DELAYED_RELEASE_TABLET | Freq: Every day | ORAL | Status: DC

## 2015-03-23 NOTE — Progress Notes (Signed)
Patient ID: Tina Arroyo, female   DOB: 20-Aug-1965, 49 y.o.   MRN: [id number]   Tina Arroyo, is a 49 y.o. female  [id number]  [id number]  DOB - Apr 05, 1965  CC:  Chief Complaint  Patient presents with  . Establish Care  . Abdominal Pain       HPI: Tina Arroyo is a 49 y.o. female here to establish care. She was seen recently in the ED with melena and was diagnosed with an upper GI bleed. She was prescribed protonix but has not filled due to lack of funds.  She was advised to followup here to establish primary care and to receive treatment for hypertension. She also has a history of migraine headaches. She reports taking amlodipine 5 mg and lisinopril 5 mg. I see minoxidil on her drug list but she reports not taking. She does need refills on the amlodipine and lisinopril. She is in need of a tetanus booster. She has had her flu shot for the year. She has had a hysterectomy for not cancerous reasons but is in need of a mammogram. No Known Allergies Past Medical History  Diagnosis Date  . Hypertension   . Migraines    Current Outpatient Prescriptions on File Prior to Visit  Medication Sig Dispense Refill  . ibuprofen (ADVIL,MOTRIN) 200 MG tablet Take 400 mg by mouth every 6 (six) hours as needed for headache, mild pain or moderate pain.     Marland Kitchen ondansetron (ZOFRAN) 4 MG tablet Take 1 tablet (4 mg total) by mouth every 8 (eight) hours as needed for nausea or vomiting. 20 tablet 0  . ketorolac (TORADOL) 10 MG tablet Take 1 tablet (10 mg total) by mouth every 6 (six) hours as needed. (Patient not taking: Reported on 03/23/2015) 20 tablet 0  . minoxidil (LONITEN) 2.5 MG tablet Take 1 tablet (2.5 mg total) by mouth 2 (two) times daily. (Patient not taking: Reported on 03/23/2015) 30 tablet 0   No current facility-administered medications on file prior to visit.   Family History  Problem Relation Age of Onset  . Diabetes Mother   . Cancer Brother   . Diabetes Maternal Grandmother    . Cancer Maternal Grandmother   . Diabetes Maternal Grandfather    Social History   Social History  . Marital Status: Single    Spouse Name: N/A  . Number of Children: N/A  . Years of Education: N/A   Occupational History  . Not on file.   Social History Main Topics  . Smoking status: Never Smoker   . Smokeless tobacco: Not on file  . Alcohol Use: No  . Drug Use: No  . Sexual Activity: Not on file   Other Topics Concern  . Not on file   Social History Narrative    Review of Systems: Constitutional: Negative for fever, chills, appetite change, weight loss,  Fatigue. Skin: Negative for rashes or lesions of concern. HENT: Negative for ear pain, ear discharge.nose bleeds Eyes: Negative for pain, discharge, redness, itching and visual disturbance. Neck: Negative for pain, stiffness Respiratory: Negative for cough, shortness of breath,   Cardiovascular: Negative for chest pain, palpitations and leg swelling. Gastrointestinal: Negative for nausea, vomiting, diarrhea, constipations. Positive for mild abdominal discomfort.  Genitourinary: Negative for dysuria, urgency, frequency, hematuria,  Musculoskeletal: Negative for joint pain, joint  swelling, and gait problem.Negative for weakness.Positive for mild chronic back pain. Neurological: Negative for dizziness, tremors, seizures, syncope,   light-headedness, numbness and headaches.  Hematological: Negative for easy bruising or bleeding  Psychiatric/Behavioral: Negative for depression, anxiety, decreased concentration, confusion   Objective:   Filed Vitals:   03/23/15 1113  BP: 134/83  Pulse: 86  Temp: 98.3 F (36.8 C)  Resp: 14    Physical Exam: Constitutional: Patient appears well-developed and well-nourished. No distress. HENT: Normocephalic, atraumatic, External right and left ear normal. Oropharynx is clear and moist.  Eyes: Conjunctivae and EOM are normal. PERRLA, no scleral icterus. Neck: Normal ROM. Neck  supple. No lymphadenopathy, No thyromegaly. CVS: RRR, S1/S2 +, no murmurs, no gallops, no rubs Pulmonary: Effort and breath sounds normal, no stridor, rhonchi, wheezes, rales.  Abdominal: Soft. Normoactive BS,, no distension, tenderness, rebound or guarding.  Musculoskeletal: Normal range of motion. No edema and no tenderness.  Neuro: Alert.Normal muscle tone coordination. Non-focal Skin: Skin is warm and dry. No rash noted. Not diaphoretic. No erythema. No pallor. Psychiatric: Normal mood and affect. Behavior, judgment, thought content normal.  Lab Results  Component Value Date   WBC 10.0 02/26/2015   HGB 12.0 02/26/2015   HCT 35.0* 02/26/2015   MCV 89.3 02/26/2015   PLT 201 02/26/2015   Lab Results  Component Value Date   CREATININE 1.01* 02/26/2015   BUN 12 02/26/2015   NA 143 02/26/2015   K 3.4* 02/26/2015   CL 110 02/26/2015   CO2 26 02/26/2015    No results found for: HGBA1C Lipid Panel  No results found for: CHOL, TRIG, HDL, CHOLHDL, VLDL, LDLCALC     Assessment and plan:   1. Health care maintenance  - COMPLETE METABOLIC PANEL WITH GFR - CBC with Differential - Lipid panel - TSH - Vitamin D 1,25 dihydroxy - MM DIGITAL SCREENING BILATERAL; Future  2. Hypertension    No Follow-up on file.  The patient was given clear instructions to go to ER or return to medical center if symptoms don't improve, worsen or new problems develop. The patient verbalized understanding.    Micheline Chapman FNP  03/23/2015, 2:00 PM

## 2015-03-23 NOTE — Patient Instructions (Signed)
Follow-up in 3 months. Pick up medications at [facility] at [address] Let of them know of financial constraints in paying for the medications When you receive, take your bill from [place] and the lab to [facility] for assistance.

## 2015-03-30 LAB — VITAMIN D 1,25 DIHYDROXY
VITAMIN D 1, 25 (OH) TOTAL: 17 pg/mL — AB (ref 18–72)
Vitamin D3 1, 25 (OH)2: 17 pg/mL

## 2015-03-31 ENCOUNTER — Telehealth: Payer: Self-pay

## 2015-03-31 ENCOUNTER — Other Ambulatory Visit (HOSPITAL_COMMUNITY): Payer: Self-pay | Admitting: Family Medicine

## 2015-03-31 MED ORDER — VITAMIN D (ERGOCALCIFEROL) 1.25 MG (50000 UNIT) PO CAPS: 50000.0000 [IU] | ORAL_CAPSULE | ORAL | Status: DC

## 2015-03-31 NOTE — Telephone Encounter (Signed)
Called and left message for patient to return call, thanks!

## 2015-03-31 NOTE — Telephone Encounter (Signed)
-----   Message from Micheline Chapman, NP sent at 03/31/2015  8:36 AM EST ----- Vitamin D low  (17). Will prescribe supplement.

## 2015-04-26 MED FILL — LISINOPRIL 5 MG TABLET: 5 | 30 days supply | Qty: 30 | Fill #1

## 2015-04-26 MED FILL — AMLODIPINE BESYLATE 5 MG TA: 5 | 30 days supply | Qty: 30 | Fill #1

## 2015-04-26 MED FILL — ?PANTOPRAZOLE SOD DR 40MG: 40 MG | 30 days supply | Qty: 30 | Fill #1

## 2015-05-04 ENCOUNTER — Other Ambulatory Visit: Payer: Self-pay | Admitting: Family Medicine

## 2015-05-04 DIAGNOSIS — Z1231 Encounter for screening mammogram for malignant neoplasm of breast: Secondary | ICD-10-CM

## 2015-05-11 ENCOUNTER — Encounter (HOSPITAL_COMMUNITY): Payer: Self-pay | Admitting: Emergency Medicine

## 2015-05-11 ENCOUNTER — Emergency Department (HOSPITAL_COMMUNITY)
Admission: EM | Admit: 2015-05-11 | Discharge: 2015-05-11 | Disposition: A | Discharge status: Home / Self Care | Payer: Self-pay | Attending: Emergency Medicine | Admitting: Emergency Medicine

## 2015-05-11 ENCOUNTER — Emergency Department (HOSPITAL_COMMUNITY): Payer: Self-pay

## 2015-05-11 DIAGNOSIS — W1839XA Other fall on same level, initial encounter: Secondary | ICD-10-CM | POA: Insufficient documentation

## 2015-05-11 DIAGNOSIS — Z79899 Other long term (current) drug therapy: Secondary | ICD-10-CM | POA: Insufficient documentation

## 2015-05-11 DIAGNOSIS — Y9389 Activity, other specified: Secondary | ICD-10-CM | POA: Insufficient documentation

## 2015-05-11 DIAGNOSIS — Y9289 Other specified places as the place of occurrence of the external cause: Secondary | ICD-10-CM | POA: Insufficient documentation

## 2015-05-11 DIAGNOSIS — S80211A Abrasion, right knee, initial encounter: Secondary | ICD-10-CM | POA: Insufficient documentation

## 2015-05-11 DIAGNOSIS — I10 Essential (primary) hypertension: Secondary | ICD-10-CM | POA: Insufficient documentation

## 2015-05-11 DIAGNOSIS — G43909 Migraine, unspecified, not intractable, without status migrainosus: Secondary | ICD-10-CM | POA: Insufficient documentation

## 2015-05-11 DIAGNOSIS — M25561 Pain in right knee: Secondary | ICD-10-CM

## 2015-05-11 DIAGNOSIS — Y998 Other external cause status: Secondary | ICD-10-CM | POA: Insufficient documentation

## 2015-05-11 MED ORDER — IBUPROFEN 200 MG PO TABS: 400.0000 mg | ORAL_TABLET | Freq: Four times a day (QID) | ORAL | Status: DC | PRN

## 2015-05-11 MED ORDER — IBUPROFEN 200 MG PO TABS
600.0000 mg | ORAL_TABLET | Freq: Once | ORAL | Status: AC
  Administered 2015-05-11: 600 mg via ORAL
  Filled 2015-05-11: qty 3

## 2015-05-11 NOTE — ED Notes (Signed)
Patient was alert, oriented and stable upon discharge. RN went over AVS and patient had no further questions.  

## 2015-05-11 NOTE — Discharge Instructions (Signed)
Your xray is normal  Wear the support for comfort as needed Take the Ibuprofen for discomfort as needed Follow up with your PCP  Cryotherapy Cryotherapy is when you put ice on your injury. Ice helps lessen pain and puffiness (swelling) after an injury. Ice works the best when you start using it in the first 24 to 48 hours after an injury. HOME CARE  Put a dry or damp towel between the ice pack and your skin.  You may press gently on the ice pack.  Leave the ice on for no more than 10 to 20 minutes at a time.  Check your skin after 5 minutes to make sure your skin is okay.  Rest at least 20 minutes between ice pack uses.  Stop using ice when your skin loses feeling (numbness).  Do not use ice on someone who cannot tell you when it hurts. This includes small children and people with memory problems (dementia). GET HELP RIGHT AWAY IF:  You have white spots on your skin.  Your skin turns blue or pale.  Your skin feels waxy or hard.  Your puffiness gets worse. MAKE SURE YOU:   Understand these instructions.  Will watch your condition.  Will get help right away if you are not doing well or get worse.   This information is not intended to replace advice given to you by your health care provider. Make sure you discuss any questions you have with your health care provider.   Document Released: 09/05/2007 Document Revised: 06/11/2011 Document Reviewed: 11/09/2010 Elsevier Interactive Patient Education [employer].
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
## 2015-06-06 MED FILL — LISINOPRIL 5 MG TABLET: 5 | 30 days supply | Qty: 30 | Fill #2

## 2015-06-06 MED FILL — ?PANTOPRAZOLE SOD DR 40MG: 40 MG | 30 days supply | Qty: 30 | Fill #2

## 2015-06-06 MED FILL — AMLODIPINE BESYLATE 5 MG TA: 5 | 30 days supply | Qty: 30 | Fill #2

## 2015-06-08 ENCOUNTER — Telehealth: Payer: Self-pay | Admitting: *Deleted

## 2015-06-08 NOTE — Telephone Encounter (Signed)
Called patient to reschedule appt and patient asked about her referral to have mammogram done. No referral in chart please advise provider.

## 2015-06-09 NOTE — Telephone Encounter (Signed)
Called and gave patient breast center number for her to call and schedule. Thanks!

## 2015-06-21 ENCOUNTER — Ambulatory Visit: Payer: Self-pay

## 2015-06-23 ENCOUNTER — Ambulatory Visit: Payer: Self-pay | Admitting: Family Medicine

## 2015-06-24 ENCOUNTER — Ambulatory Visit (INDEPENDENT_AMBULATORY_CARE_PROVIDER_SITE_OTHER): Payer: Self-pay | Admitting: Family Medicine

## 2015-06-24 ENCOUNTER — Encounter: Payer: Self-pay | Admitting: Family Medicine

## 2015-06-24 VITALS — BP 129/86 | HR 82 | Temp 98.3°F | Resp 18 | Ht 62.0 in | Wt 141.0 lb

## 2015-06-24 DIAGNOSIS — I10 Essential (primary) hypertension: Secondary | ICD-10-CM

## 2015-06-24 DIAGNOSIS — K219 Gastro-esophageal reflux disease without esophagitis: Secondary | ICD-10-CM

## 2015-06-24 DIAGNOSIS — R1010 Upper abdominal pain, unspecified: Secondary | ICD-10-CM

## 2015-06-24 DIAGNOSIS — M25561 Pain in right knee: Secondary | ICD-10-CM

## 2015-06-24 DIAGNOSIS — G8929 Other chronic pain: Secondary | ICD-10-CM

## 2015-06-24 DIAGNOSIS — R1011 Right upper quadrant pain: Secondary | ICD-10-CM

## 2015-06-24 MED ORDER — AMLODIPINE BESYLATE 5 MG PO TABS: 5.0000 mg | ORAL_TABLET | Freq: Every day | ORAL | Status: DC

## 2015-06-24 MED ORDER — LISINOPRIL 5 MG PO TABS: 5.0000 mg | ORAL_TABLET | Freq: Every day | ORAL | Status: DC

## 2015-06-24 MED ORDER — PANTOPRAZOLE SODIUM 40 MG PO TBEC: 40.0000 mg | DELAYED_RELEASE_TABLET | Freq: Every day | ORAL | Status: DC

## 2015-06-24 MED ORDER — NAPROXEN 500 MG PO TABS
500.0000 mg | ORAL_TABLET | Freq: Two times a day (BID) | ORAL | Status: DC
Start: 2015-06-24 — End: 2016-05-29

## 2015-06-24 MED FILL — NAPROXEN 500 MG TABLET: 500 | 30 days supply | Qty: 60 | Fill #0

## 2015-06-24 NOTE — Progress Notes (Signed)
Patient ID: Tina Arroyo, female   DOB: [birth year], 50 y.o.   MRN: [id number]   Tina Arroyo, is a 50 y.o. female  [id number]  [id number]  DOB - 08-13-1965  CC:  Chief Complaint  Patient presents with  . Follow-up    3 month       HPI: Tina Arroyo is a 50 y.o. female here for follow-up on hypertension. She is on amlodipine 5 mg and lisinopril 5 mg daily. She has a history of migraines but has not had one in "quite some time"  Her main complaint today is of right knee pain. She recently fell onto her right knee and continues to have medial knee pain. She has been seen in ED with a negative x-ray. She has been wearing a knee brace and taking ibuprofen.   Health Maintenance:  Need HIV screening, but will wait until she needs blood work at next visit. She needs a mammogram which has been ordered but not yet done. She needs to fill out paperwork for scholarship.  No Known Allergies Past Medical History  Diagnosis Date  . Hypertension   . Migraines    Current Outpatient Prescriptions on File Prior to Visit  Medication Sig Dispense Refill  . ibuprofen (ADVIL,MOTRIN) 200 MG tablet Take 2 tablets (400 mg total) by mouth every 6 (six) hours as needed for headache, mild pain or moderate pain. 30 tablet 0  . ondansetron (ZOFRAN) 4 MG tablet Take 1 tablet (4 mg total) by mouth every 8 (eight) hours as needed for nausea or vomiting. 20 tablet 0  . Vitamin D, Ergocalciferol, (DRISDOL) 50000 units CAPS capsule Take 1 capsule (50,000 Units total) by mouth every 7 (seven) days. 30 capsule 1  . ketorolac (TORADOL) 10 MG tablet Take 1 tablet (10 mg total) by mouth every 6 (six) hours as needed. (Patient not taking: Reported on 03/23/2015) 20 tablet 0  . minoxidil (LONITEN) 2.5 MG tablet Take 1 tablet (2.5 mg total) by mouth 2 (two) times daily. (Patient not taking: Reported on 03/23/2015) 30 tablet 0   No current facility-administered medications on file prior to visit.   Family History   Problem Relation Age of Onset  . Diabetes Mother   . Cancer Brother   . Diabetes Maternal Grandmother   . Cancer Maternal Grandmother   . Diabetes Maternal Grandfather    Social History   Social History  . Marital Status: Single    Spouse Name: N/A  . Number of Children: N/A  . Years of Education: N/A   Occupational History  . Not on file.   Social History Main Topics  . Smoking status: Never Smoker   . Smokeless tobacco: Not on file  . Alcohol Use: No  . Drug Use: No  . Sexual Activity: Not on file   Other Topics Concern  . Not on file   Social History Narrative    Review of Systems: Constitutional: Negative for fever, chills, appetite change, weight loss,  Fatigue. Skin: Negative for rashes or lesions of concern. HENT: Negative for ear pain, ear discharge.nose bleeds Eyes: Negative for pain, discharge, redness, itching and visual disturbance. Neck: Negative for pain, stiffness Respiratory: Negative for cough, shortness of breath,   Cardiovascular: Negative for chest pain, palpitations and leg swelling. Gastrointestinal: Negative for abdominal pain, nausea, vomiting, diarrhea, constipations Genitourinary: Negative for dysuria, urgency, frequency, hematuria,  Musculoskeletal: Negative for back pain, joint pain, joint  swelling, and gait problem.Negative for weakness. Neurological: Negative for dizziness, tremors, seizures, syncope,  light-headedness, numbness and headaches.  Hematological: Negative for easy bruising or bleeding Psychiatric/Behavioral: Negative for depression, anxiety, decreased concentration, confusion   Objective:   Filed Vitals:   06/24/15 1013  BP: 129/86  Pulse: 82  Temp: 98.3 F (36.8 C)  Resp: 18    Physical Exam: Constitutional: Patient appears well-developed and well-nourished. No distress. HENT: Normocephalic, atraumatic, External right and left ear normal. Oropharynx is clear and moist.  Eyes: Conjunctivae and EOM are normal.  PERRLA, no scleral icterus. Neck: Normal ROM. Neck supple. No lymphadenopathy, No thyromegaly. CVS: RRR, S1/S2 +, no murmurs, no gallops, no rubs Pulmonary: Effort and breath sounds normal, no stridor, rhonchi, wheezes, rales.  Abdominal: Soft. Normoactive BS,, no distension, tenderness, rebound or guarding.  Musculoskeletal: ROM decreased, pain with flexion and extention, tenderness to medial aspect of right knee with minor swelling Neuro: Alert.Normal muscle tone coordination. Non-focal Skin: Skin is warm and dry. No rash noted. Not diaphoretic. No erythema. No pallor. Psychiatric: Normal mood and affect. Behavior, judgment, thought content normal.  Lab Results  Component Value Date   WBC 6.1 03/23/2015   HGB 12.8 03/23/2015   HCT 38.7 03/23/2015   MCV 90.8 03/23/2015   PLT 231 03/23/2015   Lab Results  Component Value Date   CREATININE 0.80 03/23/2015   BUN 9 03/23/2015   NA 138 03/23/2015   K 3.8 03/23/2015   CL 102 03/23/2015   CO2 30 03/23/2015    No results found for: HGBA1C Lipid Panel     Component Value Date/Time   CHOL 161 03/23/2015 1204   TRIG 54 03/23/2015 1204   HDL 51 03/23/2015 1204   CHOLHDL 3.2 03/23/2015 1204   VLDL 11 03/23/2015 1204   LDLCALC 99 03/23/2015 1204       Assessment and plan:   1. Essential hypertension  - amLODipine (NORVASC) 5 MG tablet; Take 1 tablet (5 mg total) by mouth daily.  Dispense: 30 tablet; Refill: 3 - lisinopril (PRINIVIL,ZESTRIL) 5 MG tablet; Take 1 tablet (5 mg total) by mouth daily.  Dispense: 30 tablet; Refill: 3  2. Gastroesophageal reflux disease, esophagitis presence not specified  - pantoprazole (PROTONIX) 40 MG tablet; Take 1 tablet (40 mg total) by mouth daily.  Dispense: 30 tablet; Refill: 3  3. Knee pain, acute, right -Naproxen 500 mg, #60, one po q day with food, 1 RF. -Follow-up if not resolving in 2 months.  4. Need for financial assistance. -Provided information about [employer] and [employer].   Return in about 6 months (around 12/25/2015).  The patient was given clear instructions to go to ER or return to medical center if symptoms don't improve, worsen or new problems develop. The patient verbalized understanding.    Micheline Chapman FNP  06/24/2015, 10:56 AM

## 2015-06-24 NOTE — Patient Instructions (Signed)
Fill out information to get your mammogram Apply for medicaid Check on [facility] or [facility].  To apply for [employer], take your bill to [facility]

## 2015-06-24 NOTE — Progress Notes (Signed)
Patient is for 3 month FU  Patient complains of right knee inside pain. Pain is scaled currently at a 5.  Patient has taken her medications this morning. Patient has not eaten this morning.

## 2015-07-18 MED FILL — ?PANTOPRAZOLE SOD DR 40MG: 40 MG | 30 days supply | Qty: 30 | Fill #3

## 2015-07-18 MED FILL — AMLODIPINE BESYLATE 5 MG TA: 5 | 30 days supply | Qty: 30 | Fill #3

## 2015-07-18 MED FILL — LISINOPRIL 5 MG TABLET: 5 | 30 days supply | Qty: 30 | Fill #3

## 2015-07-18 MED FILL — VIT D2 1.25 MG (50,000 UNIT: 1.25 MG | 84 days supply | Qty: 12 | Fill #0

## 2015-07-20 ENCOUNTER — Other Ambulatory Visit: Payer: Self-pay | Admitting: Family Medicine

## 2015-07-20 DIAGNOSIS — Z1231 Encounter for screening mammogram for malignant neoplasm of breast: Secondary | ICD-10-CM

## 2015-07-21 ENCOUNTER — Ambulatory Visit
Admission: RE | Admit: 2015-07-21 | Discharge: 2015-07-21 | Disposition: A | Discharge status: Home / Self Care | Payer: No Typology Code available for payment source | Source: Ambulatory Visit | Attending: Family Medicine | Admitting: Family Medicine

## 2015-07-21 DIAGNOSIS — Z1231 Encounter for screening mammogram for malignant neoplasm of breast: Secondary | ICD-10-CM

## 2015-12-16 LAB — GLUCOSE, POCT (MANUAL RESULT ENTRY): POC GLUCOSE: 89 mg/dL (ref 70–99)

## 2015-12-30 ENCOUNTER — Ambulatory Visit (INDEPENDENT_AMBULATORY_CARE_PROVIDER_SITE_OTHER): Payer: Self-pay | Admitting: Family Medicine

## 2015-12-30 ENCOUNTER — Encounter: Payer: Self-pay | Admitting: Family Medicine

## 2015-12-30 VITALS — BP 127/87 | HR 91 | Temp 98.6°F | Resp 14 | Ht 62.0 in | Wt 133.0 lb

## 2015-12-30 DIAGNOSIS — Z23 Encounter for immunization: Secondary | ICD-10-CM

## 2015-12-30 DIAGNOSIS — Z114 Encounter for screening for human immunodeficiency virus [HIV]: Secondary | ICD-10-CM

## 2015-12-30 DIAGNOSIS — K219 Gastro-esophageal reflux disease without esophagitis: Secondary | ICD-10-CM

## 2015-12-30 DIAGNOSIS — Z131 Encounter for screening for diabetes mellitus: Secondary | ICD-10-CM

## 2015-12-30 DIAGNOSIS — I10 Essential (primary) hypertension: Secondary | ICD-10-CM

## 2015-12-30 DIAGNOSIS — E559 Vitamin D deficiency, unspecified: Secondary | ICD-10-CM

## 2015-12-30 LAB — LIPID PANEL
CHOL/HDL RATIO: 3.5 ratio (ref <=5.0)
Cholesterol: 149 mg/dL (ref 125–200)
HDL: 42 mg/dL — AB (ref >=46)
LDL CALC: 88 mg/dL (ref <130)
Triglycerides: 95 mg/dL (ref <150)
VLDL: 19 mg/dL (ref <30)

## 2015-12-30 LAB — CBC WITH DIFFERENTIAL/PLATELET
Basophils Absolute: 0 cells/uL (ref 0–200)
Basophils Relative: 0 %
Eosinophils Absolute: 112 cells/uL (ref 15–500)
Eosinophils Relative: 2 %
HCT: 41.3 % (ref 35.0–45.0)
Hemoglobin: 13.9 g/dL (ref 11.7–15.5)
Lymphocytes Relative: 39 %
Lymphs Abs: 2184 cells/uL (ref 850–3900)
MCH: 29.6 pg (ref 27.0–33.0)
MCHC: 33.7 g/dL (ref 32.0–36.0)
MCV: 87.9 fL (ref 80.0–100.0)
MPV: 11.2 fL (ref 7.5–12.5)
Monocytes Absolute: 504 cells/uL (ref 200–950)
Monocytes Relative: 9 %
Neutro Abs: 2800 cells/uL (ref 1500–7800)
Neutrophils Relative %: 50 %
Platelets: 224 10*3/uL (ref 140–400)
RBC: 4.7 MIL/uL (ref 3.80–5.10)
RDW: 14.1 % (ref 11.0–15.0)
WBC: 5.6 10*3/uL (ref 3.8–10.8)

## 2015-12-30 LAB — COMPLETE METABOLIC PANEL WITH GFR
ALT: 12 U/L (ref 6–29)
AST: 19 U/L (ref 10–35)
Albumin: 4.1 g/dL (ref 3.6–5.1)
Alkaline Phosphatase: 50 U/L (ref 33–115)
BUN: 12 mg/dL (ref 7–25)
CALCIUM: 9 mg/dL (ref 8.6–10.2)
CHLORIDE: 103 mmol/L (ref 98–110)
CO2: 25 mmol/L (ref 20–31)
CREATININE: 0.96 mg/dL (ref 0.50–1.10)
GFR, EST AFRICAN AMERICAN: 80 mL/min (ref >=60)
GFR, Est Non African American: 70 mL/min (ref >=60)
Glucose, Bld: 89 mg/dL (ref 65–99)
POTASSIUM: 4.3 mmol/L (ref 3.5–5.3)
Sodium: 140 mmol/L (ref 135–146)
Total Bilirubin: 0.6 mg/dL (ref 0.2–1.2)
Total Protein: 7.2 g/dL (ref 6.1–8.1)

## 2015-12-30 MED ORDER — PANTOPRAZOLE SODIUM 40 MG PO TBEC: 40.0000 mg | DELAYED_RELEASE_TABLET | Freq: Every day | ORAL | 1 refills | Status: DC

## 2015-12-30 MED ORDER — LISINOPRIL 5 MG PO TABS: 5.0000 mg | ORAL_TABLET | Freq: Every day | ORAL | 1 refills | Status: DC

## 2015-12-30 MED ORDER — VITAMIN D (ERGOCALCIFEROL) 1.25 MG (50000 UNIT) PO CAPS
50000.0000 [IU] | ORAL_CAPSULE | ORAL | 0 refills | Status: DC
Start: 2015-12-30 — End: 2016-08-31

## 2015-12-30 NOTE — Progress Notes (Signed)
Tina Arroyo, is a 50 y.o. female  [id number]  [id number]  DOB - 1965/04/11  CC:  Chief Complaint  Patient presents with  . Hypertension  . Knee Pain       HPI: Tina Arroyo is a 50 y.o. female here for follow-up hypertension. She has been out of medications for 3-4 months. Her BP today off medication is 127/87. She also has a history of MHA, up to 4 a month. She has a history of Vitamin D deficiency but is not currently taking replacement. She reports no worrisome symptoms today, except for continued knee pain since a fall earlier this year. She denies tobacco, alcohol or illicit drug use.   Health Maintenance: Receiving flu shot today. Mammogram is up to date. Has had a hysterectomy. Needs screening for diabetes with A1C. Needs screening for HIV.  No Known Allergies Past Medical History:  Diagnosis Date  . Hypertension   . Migraines    Current Outpatient Prescriptions on File Prior to Visit  Medication Sig Dispense Refill  . ibuprofen (ADVIL,MOTRIN) 200 MG tablet Take 2 tablets (400 mg total) by mouth every 6 (six) hours as needed for headache, mild pain or moderate pain. (Patient not taking: Reported on 12/30/2015) 30 tablet 0  . naproxen (NAPROSYN) 500 MG tablet Take 1 tablet (500 mg total) by mouth 2 (two) times daily with a meal. (Patient not taking: Reported on 12/30/2015) 60 tablet 0  . ondansetron (ZOFRAN) 4 MG tablet Take 1 tablet (4 mg total) by mouth every 8 (eight) hours as needed for nausea or vomiting. (Patient not taking: Reported on 12/30/2015) 20 tablet 0   No current facility-administered medications on file prior to visit.    Family History  Problem Relation Age of Onset  . Diabetes Mother   . Cancer Brother   . Diabetes Maternal Grandmother   . Cancer Maternal Grandmother   . Diabetes Maternal Grandfather    Social History   Social History  . Marital status: Single    Spouse name: N/A  . Number of children: N/A  . Years of education: N/A    Occupational History  . Not on file.   Social History Main Topics  . Smoking status: Never Smoker  . Smokeless tobacco: Never Used  . Alcohol use No  . Drug use: No  . Sexual activity: Not on file   Other Topics Concern  . Not on file   Social History Narrative  . No narrative on file    Review of Systems: Constitutional: Negative Skin: Negative HENT: Negative  Eyes: Negative  Neck: Negative Respiratory: Negative Cardiovascular: Negative Gastrointestinal: Positive for heartburn Genitourinary: Negative  Musculoskeletal: + for knee pain  Neurological: Positive for MHA Hematological: Negative  Psychiatric/Behavioral: Negative    Objective:   Vitals:   12/30/15 1136  BP: 127/87  Pulse: 91  Resp: 14  Temp: 98.6 F (37 C)    Physical Exam: Constitutional: Patient appears well-developed and well-nourished. No distress. HENT: Normocephalic, atraumatic, External right and left ear normal. Oropharynx is clear and moist.  Eyes: Conjunctivae and EOM are normal. PERRLA, no scleral icterus. Neck: Normal ROM. Neck supple. No lymphadenopathy, No thyromegaly. CVS: RRR, S1/S2 +, no murmurs, no gallops, no rubs Pulmonary: Effort and breath sounds normal, no stridor, rhonchi, wheezes, rales.  Abdominal: Soft. Normoactive BS,, no distension, tenderness, rebound or guarding.  Musculoskeletal: Normal range of motion. No edema and no tenderness.  Neuro: Alert.Normal muscle tone coordination. Non-focal Skin: Skin is warm and dry. No  rash noted. Not diaphoretic. No erythema. No pallor. Psychiatric: Normal mood and affect. Behavior, judgment, thought content normal.  Lab Results  Component Value Date   WBC 6.1 03/23/2015   HGB 12.8 03/23/2015   HCT 38.7 03/23/2015   MCV 90.8 03/23/2015   PLT 231 03/23/2015   Lab Results  Component Value Date   CREATININE 0.80 03/23/2015   BUN 9 03/23/2015   NA 138 03/23/2015   K 3.8 03/23/2015   CL 102 03/23/2015   CO2 30 03/23/2015     No results found for: HGBA1C Lipid Panel     Component Value Date/Time   CHOL 161 03/23/2015 1204   TRIG 54 03/23/2015 1204   HDL 51 03/23/2015 1204   CHOLHDL 3.2 03/23/2015 1204   VLDL 11 03/23/2015 1204   LDLCALC 99 03/23/2015 1204       Assessment and plan:   1. Need for prophylactic vaccination and inoculation against influenza  - Flu Vaccine QUAD 36+ mos PF IM (Fluarix & Fluzone Quad PF)  2. Screening for diabetes mellitus  - Hemoglobin A1c  3. Essential hypertension  - COMPLETE METABOLIC PANEL WITH GFR - CBC with Differential - Lipid panel - lisinopril (PRINIVIL,ZESTRIL) 5 MG tablet; Take 1 tablet (5 mg total) by mouth daily.  Dispense: 90 tablet; Refill: 1  4. Vitamin D deficiency  - Vitamin D 1,25 dihydroxy  5. Screening for HIV (human immunodeficiency virus)  - HIV antibody (with reflex)  6. Gastroesophageal reflux disease, esophagitis presence not specified  - pantoprazole (PROTONIX) 40 MG tablet; Take 1 tablet (40 mg total) by mouth daily.  Dispense: 90 tablet; Refill: 1   No Follow-up on file.  The patient was given clear instructions to go to ER or return to medical center if symptoms don't improve, worsen or new problems develop. The patient verbalized understanding.    Micheline Chapman FNP  12/30/2015, 12:04 PM

## 2015-12-30 NOTE — Patient Instructions (Signed)
Recomment Excedrin for MHA.

## 2015-12-31 LAB — HEMOGLOBIN A1C
HEMOGLOBIN A1C: 5.3 % (ref <5.7)
Mean Plasma Glucose: 105 mg/dL

## 2015-12-31 LAB — HIV ANTIBODY (ROUTINE TESTING W REFLEX): HIV: NONREACTIVE

## 2016-01-02 MED FILL — PANTOPRAZOLE SOD DR 40 MG T: 40 | 30 days supply | Qty: 30 | Fill #0

## 2016-01-02 MED FILL — LISINOPRIL 5 MG TABLET: 5 | 30 days supply | Qty: 30 | Fill #0

## 2016-01-02 MED FILL — VIT D2 1.25 MG (50,000 UNIT: 1.25 MG | 84 days supply | Qty: 12 | Fill #0

## 2016-01-03 LAB — VITAMIN D 1,25 DIHYDROXY
VITAMIN D 1, 25 (OH) TOTAL: 33 pg/mL (ref 18–72)
VITAMIN D3 1, 25 (OH): 9 pg/mL
Vitamin D2 1, 25 (OH)2: 24 pg/mL

## 2016-03-12 MED FILL — ?PANTOPRAZOLE SOD DR 40MG: 40 MG | 30 days supply | Qty: 30 | Fill #1

## 2016-03-12 MED FILL — LISINOPRIL 5 MG TABLET: 5 | 30 days supply | Qty: 30 | Fill #1

## 2016-03-12 MED FILL — VIT D2 1.25 MG (50,000 UNIT: 1.25 MG | 84 days supply | Qty: 12 | Fill #1

## 2016-03-31 IMAGING — CT CT HEAD W/O CM
1 series · 16 of 30 positions shown, 20 images · non-contrast
Comparison: 04/21/2013

CLINICAL DATA: Headache and hypertension.

EXAM:
CT HEAD WITHOUT CONTRAST
TECHNIQUE: Contiguous axial images were obtained from the base of the skull
through the vertex without intravenous contrast.

[Series 2: head 5.0 h30s · axial · 0.41mm/px · z∈[-154,-19]mm · 16 of 30 slices shown, 20 images]
[im 2/30  brain]
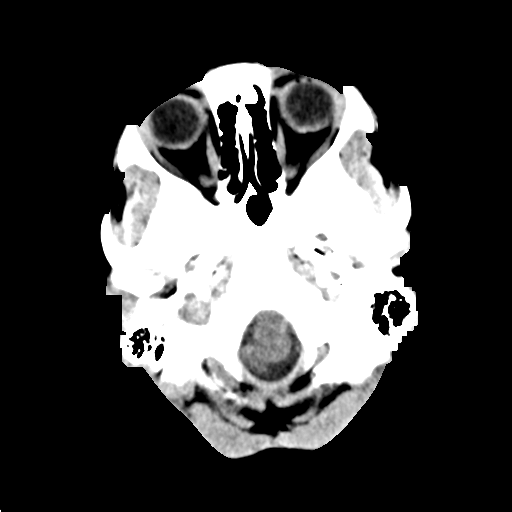
[im 2/30  bone]
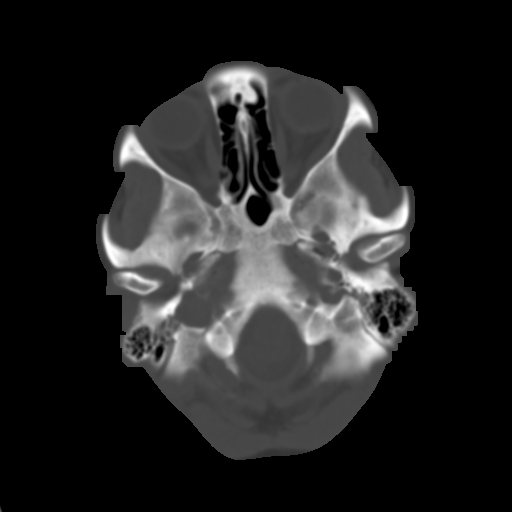
[im 4/30  brain]
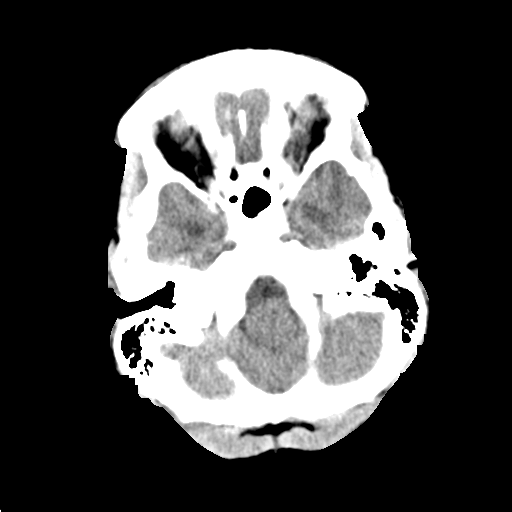
[im 6/30  brain]
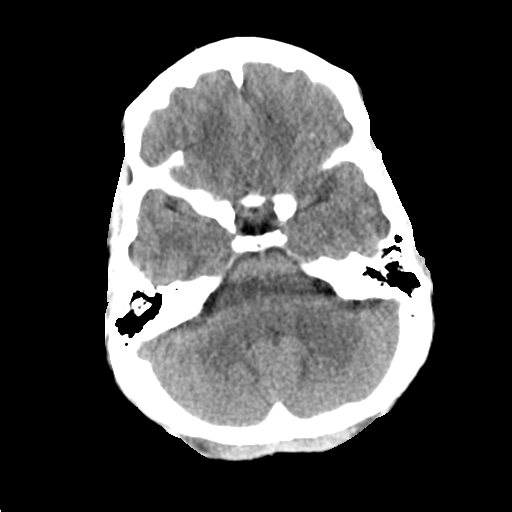
[im 8/30  brain]
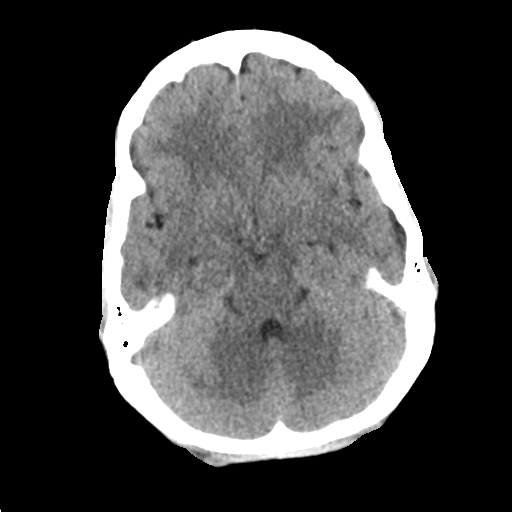
[im 9/30  brain]
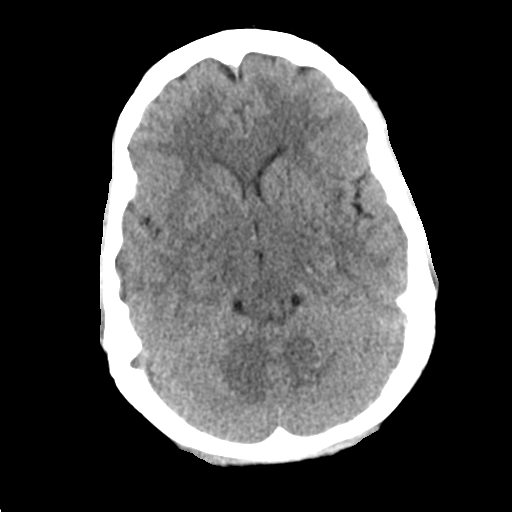
[im 9/30  bone]
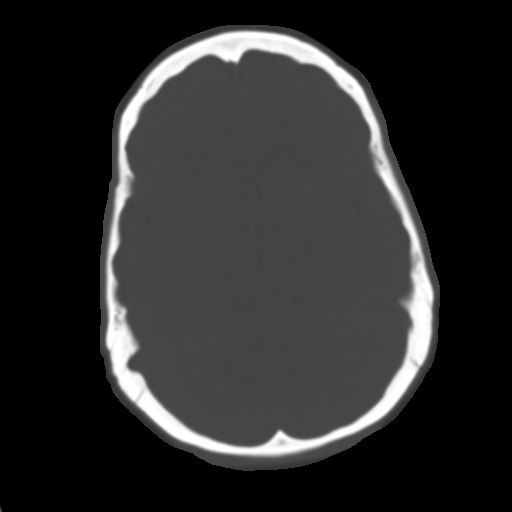
[im 11/30  brain]
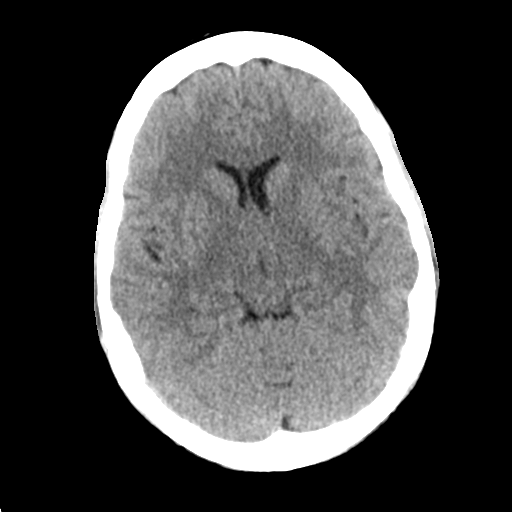
[im 13/30  brain]
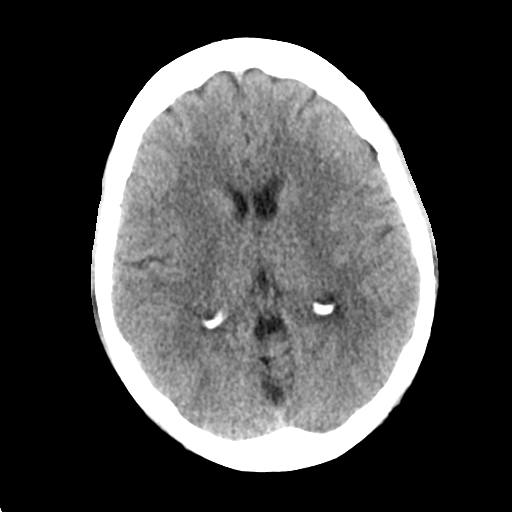
[im 15/30  brain]
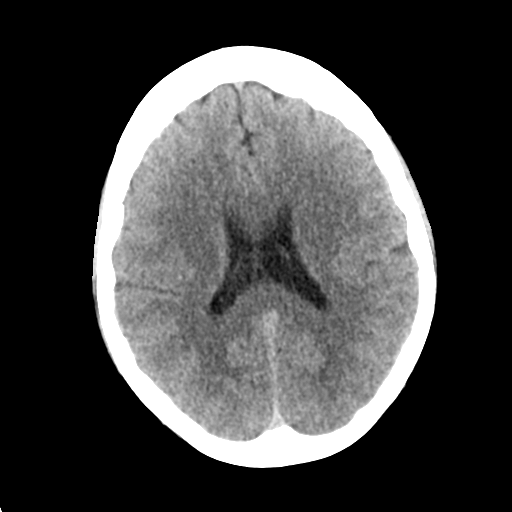
[im 16/30  brain]
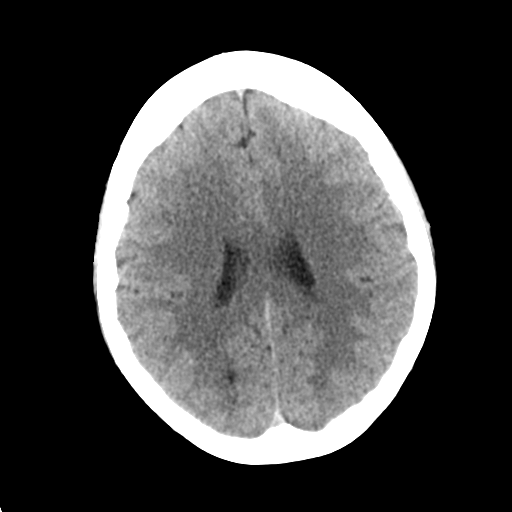
[im 16/30  bone]
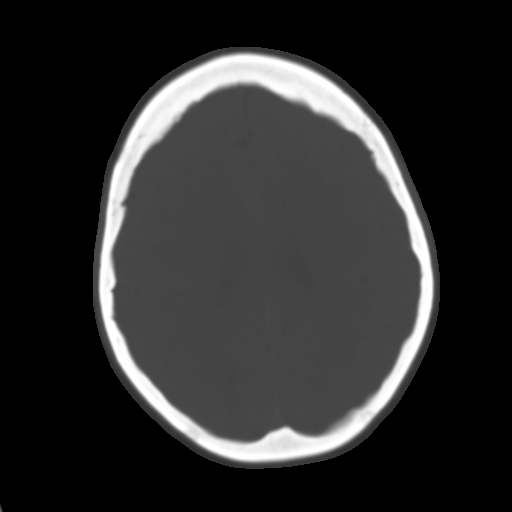
[im 18/30  brain]
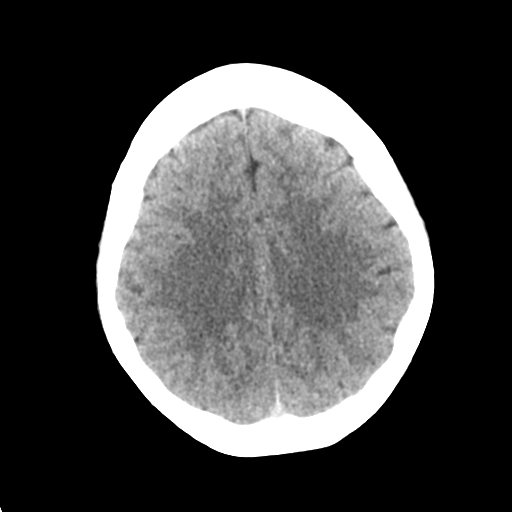
[im 20/30  brain]
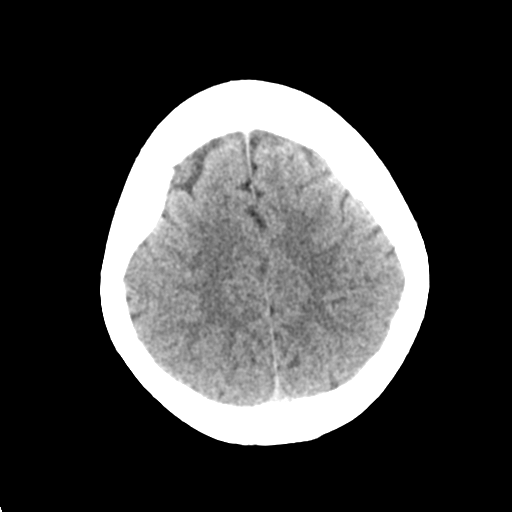
[im 22/30  brain]
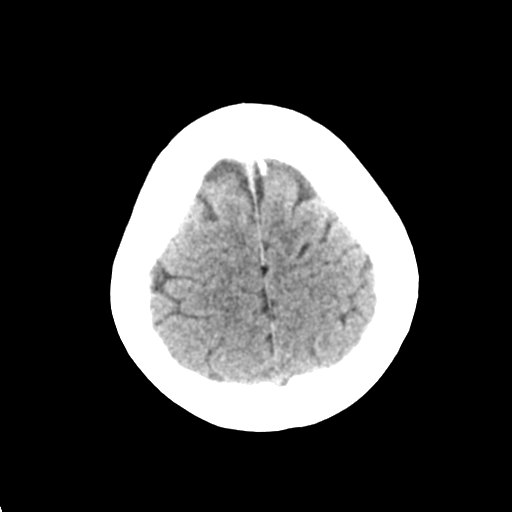
[im 23/30  brain]
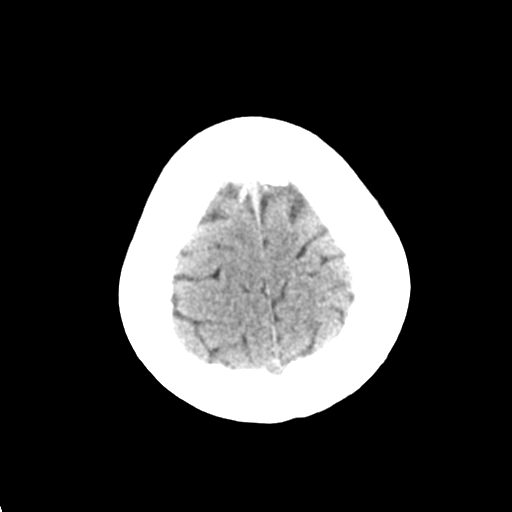
[im 23/30  bone]
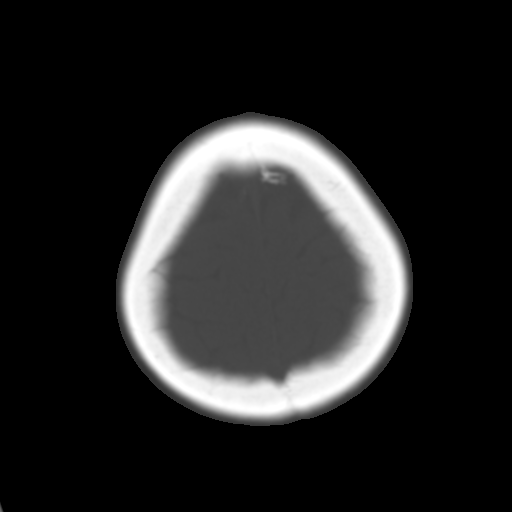
[im 25/30  brain]
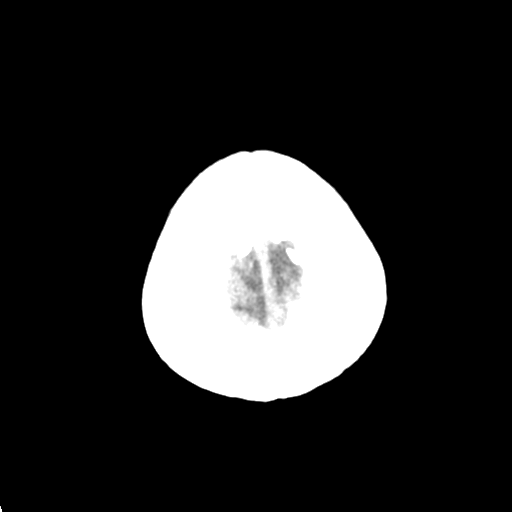
[im 27/30  brain]
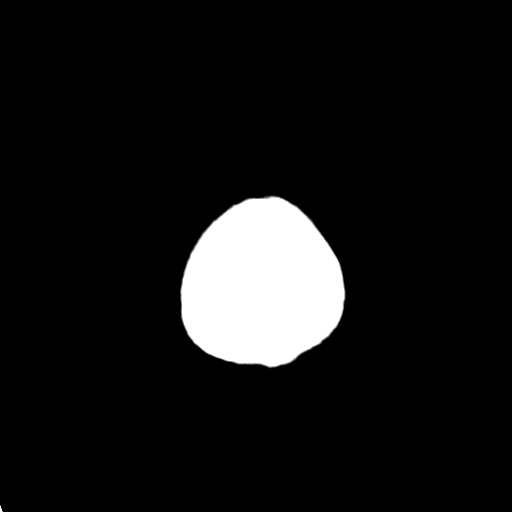
[im 29/30  brain]
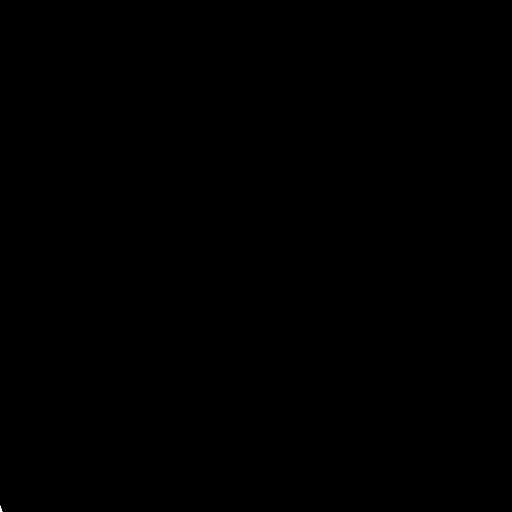

[16 of 30 positions shown; findings below may reference images not displayed]

FINDINGS: The brain demonstrates no evidence of hemorrhage, infarction, edema,
mass effect, extra-axial fluid collection, hydrocephalus or mass
lesion. The skull is unremarkable.
IMPRESSION: Normal head CT.

## 2016-05-22 MED FILL — VIT D2 1.25 MG (50,000 UNIT: 1.25 MG | 41 days supply | Qty: 6 | Fill #2

## 2016-05-22 MED FILL — ?PANTOPRAZOLE SOD DR 40MG: 40 MG | 30 days supply | Qty: 30 | Fill #2

## 2016-05-22 MED FILL — ?LISINOPRIL 5 MG TABLET: 5 | 30 days supply | Qty: 30 | Fill #2

## 2016-05-29 ENCOUNTER — Ambulatory Visit (INDEPENDENT_AMBULATORY_CARE_PROVIDER_SITE_OTHER): Payer: BLUE CROSS/BLUE SHIELD | Admitting: Family Medicine

## 2016-05-29 ENCOUNTER — Encounter: Payer: Self-pay | Admitting: Family Medicine

## 2016-05-29 ENCOUNTER — Encounter: Payer: Self-pay | Admitting: Gastroenterology

## 2016-05-29 VITALS — BP 142/92 | HR 71 | Ht 63.0 in | Wt 129.6 lb

## 2016-05-29 DIAGNOSIS — Z0000 Encounter for general adult medical examination without abnormal findings: Secondary | ICD-10-CM

## 2016-05-29 DIAGNOSIS — K921 Melena: Secondary | ICD-10-CM

## 2016-05-29 DIAGNOSIS — Z1231 Encounter for screening mammogram for malignant neoplasm of breast: Secondary | ICD-10-CM

## 2016-05-29 DIAGNOSIS — R1011 Right upper quadrant pain: Secondary | ICD-10-CM | POA: Diagnosis not present

## 2016-05-29 DIAGNOSIS — I10 Essential (primary) hypertension: Secondary | ICD-10-CM | POA: Diagnosis not present

## 2016-05-29 DIAGNOSIS — Z1239 Encounter for other screening for malignant neoplasm of breast: Secondary | ICD-10-CM

## 2016-05-29 DIAGNOSIS — G8929 Other chronic pain: Secondary | ICD-10-CM

## 2016-05-29 LAB — POCT URINALYSIS DIPSTICK
Bilirubin, UA: NEGATIVE
Blood, UA: NEGATIVE
Glucose, UA: NEGATIVE
KETONES UA: NEGATIVE
LEUKOCYTES UA: NEGATIVE
NITRITE UA: NEGATIVE
PROTEIN UA: NEGATIVE
Spec Grav, UA: >=1.03
Urobilinogen, UA: NEGATIVE
pH, UA: 6

## 2016-05-29 LAB — COMPREHENSIVE METABOLIC PANEL
ALBUMIN: 3.7 g/dL (ref 3.6–5.1)
ALK PHOS: 54 U/L (ref 33–130)
ALT: 14 U/L (ref 6–29)
AST: 19 U/L (ref 10–35)
BILIRUBIN TOTAL: 0.3 mg/dL (ref 0.2–1.2)
BUN: 10 mg/dL (ref 7–25)
CO2: 28 mmol/L (ref 20–31)
CREATININE: 1 mg/dL (ref 0.50–1.05)
Calcium: 8.7 mg/dL (ref 8.6–10.4)
Chloride: 106 mmol/L (ref 98–110)
Glucose, Bld: 93 mg/dL (ref 65–99)
Potassium: 3.9 mmol/L (ref 3.5–5.3)
SODIUM: 140 mmol/L (ref 135–146)
TOTAL PROTEIN: 6.7 g/dL (ref 6.1–8.1)

## 2016-05-29 LAB — CBC WITH DIFFERENTIAL/PLATELET
BASOS ABS: 0 {cells}/uL (ref 0–200)
BASOS PCT: 0 %
EOS PCT: 1 %
Eosinophils Absolute: 70 cells/uL (ref 15–500)
HCT: 37.8 % (ref 35.0–45.0)
HEMOGLOBIN: 12.7 g/dL (ref 11.7–15.5)
LYMPHS ABS: 2590 {cells}/uL (ref 850–3900)
Lymphocytes Relative: 37 %
MCH: 29.8 pg (ref 27.0–33.0)
MCHC: 33.6 g/dL (ref 32.0–36.0)
MCV: 88.7 fL (ref 80.0–100.0)
MONOS PCT: 9 %
MPV: 10.8 fL (ref 7.5–12.5)
Monocytes Absolute: 630 cells/uL (ref 200–950)
NEUTROS ABS: 3710 {cells}/uL (ref 1500–7800)
Neutrophils Relative %: 53 %
PLATELETS: 218 10*3/uL (ref 140–400)
RBC: 4.26 MIL/uL (ref 3.80–5.10)
RDW: 14.5 % (ref 11.0–15.0)
WBC: 7 10*3/uL (ref 4.0–10.5)

## 2016-05-29 LAB — TSH: TSH: 0.44 m[IU]/L

## 2016-05-29 MED ORDER — LISINOPRIL 10 MG PO TABS: 10.0000 mg | ORAL_TABLET | Freq: Every day | ORAL | 3 refills | Status: DC

## 2016-05-29 NOTE — Patient Instructions (Addendum)
Start taking Lisinopril 10 mg daily to get your blood pressure under better control. Goal range is <130/80.  Eat a low salt diet.   The GI office will call you to schedule an appointment for the blood in your stool and reflux. Continue taking your current medication for reflux.    Heartburn Heartburn is a type of pain or discomfort that can happen in the throat or chest. It is often described as a burning pain. It may also cause a bad taste in the mouth. Heartburn may feel worse when you lie down or bend over, and it is often worse at night. Heartburn may be caused by stomach contents that move back up into the esophagus (reflux). Follow these instructions at home: Take these actions to decrease your discomfort and to help avoid complications. Diet   Follow a diet as recommended by your health care provider. This may involve avoiding foods and drinks such as:  Coffee and tea (with or without caffeine).  Drinks that contain alcohol.  Energy drinks and sports drinks.  Carbonated drinks or sodas.  Chocolate and cocoa.  Peppermint and mint flavorings.  Garlic and onions.  Horseradish.  Spicy and acidic foods, including peppers, chili powder, curry powder, vinegar, hot sauces, and barbecue sauce.  Citrus fruit juices and citrus fruits, such as oranges, lemons, and limes.  Tomato-based foods, such as red sauce, chili, salsa, and pizza with red sauce.  Fried and fatty foods, such as donuts, french fries, potato chips, and high-fat dressings.  High-fat meats, such as hot dogs and fatty cuts of red and white meats, such as rib eye steak, sausage, ham, and bacon.  High-fat dairy items, such as whole milk, butter, and cream cheese.  Eat small, frequent meals instead of large meals.  Avoid drinking large amounts of liquid with your meals.  Avoid eating meals during the 2-3 hours before bedtime.  Avoid lying down right after you eat.  Do not exercise right after you eat. General  instructions   Pay attention to any changes in your symptoms.  Take over-the-counter and prescription medicines only as told by your health care provider. Do not take aspirin, ibuprofen, or other NSAIDs unless your health care provider told you to do so.  Do not use any tobacco products, including cigarettes, chewing tobacco, and e-cigarettes. If you need help quitting, ask your health care provider.  Wear loose-fitting clothing. Do not wear anything tight around your waist that causes pressure on your abdomen.  Raise (elevate) the head of your bed about 6 inches (15 cm).  Try to reduce your stress, such as with yoga or meditation. If you need help reducing stress, ask your health care provider.  If you are overweight, reduce your weight to an amount that is healthy for you. Ask your health care provider for guidance about a safe weight loss goal.  Keep all follow-up visits as told by your health care provider. This is important. Contact a health care provider if:  You have new symptoms.  You have unexplained weight loss.  You have difficulty swallowing, or it hurts to swallow.  You have wheezing or a persistent cough.  Your symptoms do not improve with treatment.  You have frequent heartburn for more than two weeks. Get help right away if:  You have pain in your arms, neck, jaw, teeth, or back.  You feel sweaty, dizzy, or light-headed.  You have chest pain or shortness of breath.  You vomit and your vomit looks like  blood or coffee grounds.  Your stool is bloody or black. This information is not intended to replace advice given to you by your health care provider. Make sure you discuss any questions you have with your health care provider. Document Released: 08/05/2008 Document Revised: 08/25/2015 Document Reviewed: 07/14/2014 Elsevier Interactive Patient Education  2017 [employer] for Adults - Female      Lithium:  A  routine yearly physical is a good way to check in with your primary care provider about your health and preventive screening. It is also an opportunity to share updates about your health and any concerns you have, and receive a thorough all-over exam.   Most health insurance companies pay for at least some preventative services.  Check with your health plan for specific coverages.  WHAT PREVENTATIVE SERVICES DO WOMEN NEED?  Adult women should have their weight and blood pressure checked regularly.   Women age 72 and older should have their cholesterol levels checked regularly.  Women should be screened for cervical cancer with a Pap smear and pelvic exam beginning at either age 66, or 3 years after they become sexually activity.    Breast cancer screening generally begins at age 61 with a mammogram and breast exam by your primary care provider.    Beginning at age 17 and continuing to age 38, women should be screened for colorectal cancer.  Certain people may need continued testing until age 74.  Updating vaccinations is part of preventative care.  Vaccinations help protect against diseases such as the flu.  Osteoporosis is a disease in which the bones lose minerals and strength as we age. Women ages 41 and over should discuss this with their caregivers, as should women after menopause who have other risk factors.  Lab tests are generally done as part of preventative care to screen for anemia and blood disorders, to screen for problems with the kidneys and liver, to screen for bladder problems, to check blood sugar, and to check your cholesterol level.  Preventative services generally include counseling about diet, exercise, avoiding tobacco, drugs, excessive alcohol consumption, and sexually transmitted infections.    GENERAL RECOMMENDATIONS FOR GOOD HEALTH:  Healthy diet:  Eat a variety of foods, including fruit, vegetables, animal or vegetable protein, such as meat, fish, chicken, and  eggs, or beans, lentils, tofu, and grains, such as rice.  Drink plenty of water daily.  Decrease saturated fat in the diet, avoid lots of red meat, processed foods, sweets, fast foods, and fried foods.  Exercise:  Aerobic exercise helps maintain good heart health. At least 30-40 minutes of moderate-intensity exercise is recommended. For example, a brisk walk that increases your heart rate and breathing. This should be done on most days of the week.   Find a type of exercise or a variety of exercises that you enjoy so that it becomes a part of your daily life.  Examples are running, walking, swimming, water aerobics, and biking.  For motivation and support, explore group exercise such as aerobic class, spin class, Zumba, Yoga,or  martial arts, etc.    Set exercise goals for yourself, such as a certain weight goal, walk or run in a race such as a 5k walk/run.  Speak to your primary care provider about exercise goals.  Disease prevention:  If you smoke or chew tobacco, find out from your caregiver how to quit. It can literally save your life, no matter how long you have been a  tobacco user. If you do not use tobacco, never begin.   Maintain a healthy diet and normal weight. Increased weight leads to problems with blood pressure and diabetes.   The Body Mass Index or BMI is a way of measuring how much of your body is fat. Having a BMI above 27 increases the risk of heart disease, diabetes, hypertension, stroke and other problems related to obesity. Your caregiver can help determine your BMI and based on it develop an exercise and dietary program to help you achieve or maintain this important measurement at a healthful level.  High blood pressure causes heart and blood vessel problems.  Persistent high blood pressure should be treated with medicine if weight loss and exercise do not work.   Fat and cholesterol leaves deposits in your arteries that can block them. This causes heart disease and  vessel disease elsewhere in your body.  If your cholesterol is found to be high, or if you have heart disease or certain other medical conditions, then you may need to have your cholesterol monitored frequently and be treated with medication.   Ask if you should have a cardiac stress test if your history suggests this. A stress test is a test done on a treadmill that looks for heart disease. This test can find disease prior to there being a problem.  Menopause can be associated with physical symptoms and risks. Hormone replacement therapy is available to decrease these. You should talk to your caregiver about whether starting or continuing to take hormones is right for you.   Osteoporosis is a disease in which the bones lose minerals and strength as we age. This can result in serious bone fractures. Risk of osteoporosis can be identified using a bone density scan. Women ages 1 and over should discuss this with their caregivers, as should women after menopause who have other risk factors. Ask your caregiver whether you should be taking a calcium supplement and Vitamin D, to reduce the rate of osteoporosis.   Avoid drinking alcohol in excess (more than two drinks per day).  Avoid use of street drugs. Do not share needles with anyone. Ask for professional help if you need assistance or instructions on stopping the use of alcohol, cigarettes, and/or drugs.  Brush your teeth twice a day with fluoride toothpaste, and floss once a day. Good oral hygiene prevents tooth decay and gum disease. The problems can be painful, unattractive, and can cause other health problems. Visit your dentist for a routine oral and dental check up and preventive care every 6-12 months.   Look at your skin regularly.  Use a mirror to look at your back. Notify your caregivers of changes in moles, especially if there are changes in shapes, colors, a size larger than a pencil eraser, an irregular border, or development of new  moles.  Safety:  Use seatbelts 100% of the time, whether driving or as a passenger.  Use safety devices such as hearing protection if you work in environments with loud noise or significant background noise.  Use safety glasses when doing any work that could send debris in to the eyes.  Use a helmet if you ride a bike or motorcycle.  Use appropriate safety gear for contact sports.  Talk to your caregiver about gun safety.  Use sunscreen with a SPF (or skin protection factor) of 15 or greater.  Lighter skinned people are at a greater risk of skin cancer. Don't forget to also wear sunglasses in order to protect  your eyes from too much damaging sunlight. Damaging sunlight can accelerate cataract formation.   Practice safe sex. Use condoms. Condoms are used for birth control and to help reduce the spread of sexually transmitted infections (or STIs).  Some of the STIs are gonorrhea (the clap), chlamydia, syphilis, trichomonas, herpes, HPV (human papilloma virus) and HIV (human immunodeficiency virus) which causes AIDS. The herpes, HIV and HPV are viral illnesses that have no cure. These can result in disability, cancer and death.   Keep carbon monoxide and smoke detectors in your home functioning at all times. Change the batteries every 6 months or use a model that plugs into the wall.   Vaccinations:  Stay up to date with your tetanus shots and other required immunizations. You should have a booster for tetanus every 10 years. Be sure to get your flu shot every year, since 5%-20% of the [place] population comes down with the flu. The flu vaccine changes each year, so being vaccinated once is not enough. Get your shot in the fall, before the flu season peaks.   Other vaccines to consider:  Human Papilloma Virus or HPV causes cancer of the cervix, and other infections that can be transmitted from person to person. There is a vaccine for HPV, and females should get immunized between the ages of 66 and 6. It  requires a series of 3 shots.   Pneumococcal vaccine to protect against certain types of pneumonia.  This is normally recommended for adults age 85 or older.  However, adults younger than 50 years old with certain underlying conditions such as diabetes, heart or lung disease should also receive the vaccine.  Shingles vaccine to protect against Varicella Zoster if you are older than age 61, or younger than 50 years old with certain underlying illness.  Hepatitis A vaccine to protect against a form of infection of the liver by a virus acquired from food.  Hepatitis B vaccine to protect against a form of infection of the liver by a virus acquired from blood or body fluids, particularly if you work in health care.  If you plan to travel internationally, check with your local health department for specific vaccination recommendations.  Cancer Screening:  Breast cancer screening is essential to preventive care for women. All women age 84 and older should perform a breast self-exam every month. At age 55 and older, women should have their caregiver complete a breast exam each year. Women at ages 12 and older should have a mammogram (x-ray film) of the breasts. Your caregiver can discuss how often you need mammograms.    Cervical cancer screening includes taking a Pap smear (sample of cells examined under a microscope) from the cervix (end of the uterus). It also includes testing for HPV (Human Papilloma Virus, which can cause cervical cancer). Screening and a pelvic exam should begin at age 76, or 3 years after a woman becomes sexually active. Screening should occur every year, with a Pap smear but no HPV testing, up to age 69. After age 40, you should have a Pap smear every 3 years with HPV testing, if no HPV was found previously.   Most routine colon cancer screening begins at the age of 22. On a yearly basis, doctors may provide special easy to use take-home tests to check for hidden blood in the  stool. Sigmoidoscopy or colonoscopy can detect the earliest forms of colon cancer and is life saving. These tests use a small camera at the end of a tube  to directly examine the colon. Speak to your caregiver about this at age 77, when routine screening begins (and is repeated every 5 years unless early forms of pre-cancerous polyps or small growths are found).

## 2016-05-29 NOTE — Progress Notes (Signed)
Subjective:    Patient ID: Tina Arroyo, female    DOB: [birth year], 51 y.o.   MRN: [id number]  HPI Chief Complaint  Patient presents with  . physical    physical. sometimes when having bowel movements she has blood in stool. and then sometimes she feels like her left arm is asleep.    She is new to the practice and here for a complete physical exam. Previous medical care: states she has not had any insurance for several years. Has been going to [facility].   Last CPE: 10 years ago.   Complains of occasional bright red blood in her stool for past 6 months. Denies ever having a colonoscopy. History of bleeding stomach ulcer in past with EGD by Dr. Michail Sermon. History of reflux and taking a PPI.  History of HTN and currently taking lisinopril. Does not check BP at home.   Other providers: [facility]- [facility] exam.   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  Denies smoking, drinking alcohol, drug use  Diet: nothing particular diet Excerise: nothing   Immunizations: flu shot up to date , Tdap in 2016  Health maintenance:  Mammogram: July 21, 2015 Colonoscopy: never  Hysterectomy.  Last Menstrual cycle: n/a Pregnancies: 3 kids. All adult.  Last Dental Exam: years ago. Last Eye Exam: December 2017.   Wears seatbelt always, smoke detectors in home and functioning, does not text while driving and feels safe in home environment.   Reviewed allergies, medications, past medical, surgical, family, and social history.   Review of Systems Review of Systems Constitutional: -fever, -chills, -sweats, -unexpected weight change,-fatigue ENT: -runny nose, -ear pain, -sore throat Cardiology:  -chest pain, -palpitations, -edema Respiratory: -cough, -shortness of breath, -wheezing Gastroenterology: -abdominal pain, -nausea, -vomiting, -diarrhea, -constipation  Hematology: -bleeding or bruising problems Musculoskeletal: -arthralgias,  -myalgias, -joint swelling, -back pain Ophthalmology: -vision changes Urology: -dysuria, -difficulty urinating, -hematuria, -urinary frequency, -urgency Neurology: -headache, -weakness, -tingling, -numbness       Objective:   Physical Exam BP (!) 142/92   Pulse 71   Ht 5\' 3"  (1.6 m)   Wt 129 lb 9.6 oz (58.8 kg)   BMI 22.96 kg/m   General Appearance:    Alert, cooperative, no distress, appears stated age  Head:    Normocephalic, without obvious abnormality, atraumatic  Eyes:    PERRL, conjunctiva/corneas clear, EOM's intact, fundi    benign  Ears:    Normal TM's and external ear canals  Nose:   Nares normal, mucosa normal, no drainage or sinus   tenderness  Throat:   Lips, mucosa, and tongue normal; teeth and gums normal  Neck:   Supple, no lymphadenopathy;  thyroid:  no   enlargement/tenderness/nodules; no carotid   bruit or JVD  Back:    Spine nontender, no curvature, ROM normal, no CVA     tenderness  Lungs:     Clear to auscultation bilaterally without wheezes, rales or     ronchi; respirations unlabored  Chest Wall:    No tenderness or deformity   Heart:    Regular rate and rhythm, S1 and S2 normal, no murmur, rub   or gallop  Breast Exam:    Declined. Mammogram ordered.   Abdomen:     Soft, mild epigastric tenderness without rebound, otherwise non tender, , nondistended, normoactive bowel sounds,    no masses, no hepatosplenomegaly  Genitalia:    Refused.  Pap not indicated.  Extremities:   No clubbing, cyanosis or edema  Pulses:   2+ and symmetric all extremities  Skin:   Skin color, texture, turgor normal, no rashes or lesions  Lymph nodes:   Cervical, supraclavicular, and axillary nodes normal  Neurologic:   CNII-XII intact, normal strength, sensation and gait; reflexes 2+ and symmetric throughout          Psych:   Normal mood, affect, hygiene and grooming.    Urinalysis dipstick: negative      Assessment & Plan:  Routine general medical examination at a  health care facility - Plan: Urinalysis Dipstick, CBC with Differential/Platelet, Comprehensive metabolic panel, TSH  Hypertension, unspecified type - Plan: CBC with Differential/Platelet, Comprehensive metabolic panel  Abdominal pain, chronic, right upper quadrant - Plan: Ambulatory referral to Gastroenterology  Blood in stool - Plan: Ambulatory referral to Gastroenterology  Screening for breast cancer - Plan: MM DIGITAL SCREENING BILATERAL  Patient states she only wants a physical today and not be billed for a separate office visit. She will return for chronic health conditions.  Discussed that her blood pressure is not well controlled on current dose of lisinopril. She has been taking 5mg  and this was initiated by a provider at the [facility]. Will increase her dose to 10 mg and have her start checking her BP. Plan to recheck her BP in 4 weeks.  She is taking daily PPI for diagnosis of GERD by her previous provider. Will have her continue taking this. She does have a history of stomach ulcer and has seen Dr. Michail Sermon in the past. Will refer her to GI for this and for complaint of blood in stool intermittently for the past 6 months or so. Will check CBC and other labs.  Discussed health maintenance and immunizations.  She will be due for annual mammogram in April. Order is in the system.  Follow up in 4 weeks for HTN.  She is taking vitamin D 50, 000 IU once weekly for the next 6 weeks.

## 2016-05-30 ENCOUNTER — Encounter: Payer: Self-pay | Admitting: Family Medicine

## 2016-05-31 ENCOUNTER — Encounter: Payer: Self-pay | Admitting: Internal Medicine

## 2016-06-07 ENCOUNTER — Ambulatory Visit: Payer: No Typology Code available for payment source | Admitting: Gastroenterology

## 2016-06-14 ENCOUNTER — Encounter: Payer: Self-pay | Admitting: Gastroenterology

## 2016-06-14 ENCOUNTER — Ambulatory Visit (INDEPENDENT_AMBULATORY_CARE_PROVIDER_SITE_OTHER): Payer: BLUE CROSS/BLUE SHIELD | Admitting: Gastroenterology

## 2016-06-14 VITALS — BP 102/78 | HR 72 | Ht 62.0 in | Wt 125.0 lb

## 2016-06-14 DIAGNOSIS — Z1211 Encounter for screening for malignant neoplasm of colon: Secondary | ICD-10-CM | POA: Insufficient documentation

## 2016-06-14 DIAGNOSIS — K625 Hemorrhage of anus and rectum: Secondary | ICD-10-CM | POA: Diagnosis not present

## 2016-06-14 DIAGNOSIS — R110 Nausea: Secondary | ICD-10-CM | POA: Diagnosis not present

## 2016-06-14 DIAGNOSIS — R1013 Epigastric pain: Secondary | ICD-10-CM | POA: Insufficient documentation

## 2016-06-14 DIAGNOSIS — R142 Eructation: Secondary | ICD-10-CM | POA: Insufficient documentation

## 2016-06-14 MED ORDER — PANTOPRAZOLE SODIUM 40 MG PO TBEC: 40.0000 mg | DELAYED_RELEASE_TABLET | Freq: Two times a day (BID) | ORAL | 3 refills | Status: DC

## 2016-06-14 MED ORDER — PANTOPRAZOLE SODIUM 40 MG PO TBEC: 40.0000 mg | DELAYED_RELEASE_TABLET | Freq: Every day | ORAL | 3 refills | Status: DC

## 2016-06-14 MED ORDER — NA SULFATE-K SULFATE-MG SULF 17.5-3.13-1.6 GM/177ML PO SOLN: 1.0000 | ORAL | 0 refills | Status: DC

## 2016-06-14 NOTE — Patient Instructions (Signed)
You have been scheduled for an endoscopy and colonoscopy. Please follow the written instructions given to you at your visit today. Please pick up your prep supplies at the pharmacy within the next 1-3 days. If you use inhalers (even only as needed), please bring them with you on the day of your procedure. Your physician has requested that you go to www.startemmi.com and enter the access code given to you at your visit today. This web site gives a general overview about your procedure. However, you should still follow specific instructions given to you by our office regarding your preparation for the procedure.  Increase Pantoprazole to 40 mg twice a day.

## 2016-06-14 NOTE — Progress Notes (Addendum)
06/14/2016 Tina Arroyo [id number] 01-03-1966   HISTORY OF PRESENT ILLNESS:  This is a 51 year old female who is new to our practice and has been referred here by Harland Dingwall, NP, for evaluation regarding rectal bleeding and abdominal pain. She tells me that her abdominal pain is in her upper abdomen and has been present on a daily basis for the past 2.5 months. She says this does seem to come and go throughout the day but causes her much discomfort several times a day. She says that when the pain is present it lasts 30 minutes to 3 hours before easing up. She describes it as a sharp burning pain. Sometimes seems to radiate to the LUQ.  Says that it seems to be worse when she eats tomato-based products like spaghetti sauce, etc. She also complains of nausea, but no vomiting. Also complains of constant belching.  She had an EGD by Dr. Michail Sermon in November 2016 while at [facility] with complaints of melena. EGD showed some minimal antral gastritis but was otherwise normal.  Recent CBC, CMP, TSH were within normal limits. She has been on pantoprazole 40 mg daily at least since that hospitalization in November 2016. She has recently been placed on Duexis, which is ibuprofen with famotidine, but has only been taking that for less than a month for foot/ankle pain.  No other NSAID use.  She also complains of seeing blood in her stools. She describes it as bright red color blood on and off with bowel movements. She denies any issues with moving her bowels such as constipation or diarrhea.  She's never had colonoscopy in the past.    Past Medical History:  Diagnosis Date  . Bleeding stomach ulcer 2016  . Hypertension   . Migraines    Past Surgical History:  Procedure Laterality Date  . ABDOMINAL HYSTERECTOMY    . ESOPHAGOGASTRODUODENOSCOPY N/A 02/25/2015   Procedure: ESOPHAGOGASTRODUODENOSCOPY (EGD);  Surgeon: Wilford Corner, MD;  Location: [facility];  Service: Endoscopy;   Laterality: N/A;  . FOOT SURGERY    . knee     left "ligament got tore up" surgery  . MULTIPLE TOOTH EXTRACTIONS    . TUBAL LIGATION      reports that she has never smoked. She has never used smokeless tobacco. She reports that she does not drink alcohol or use drugs. family history includes Breast cancer in her maternal aunt and maternal grandmother; Cancer in her brother; Diabetes in her maternal grandfather and mother; Heart disease in her maternal grandmother. No Known Allergies    Outpatient Encounter Prescriptions as of 06/14/2016  Medication Sig  . DUEXIS 800-26.6 MG TABS Take 1 tablet by mouth 2 (two) times daily.   Marland Kitchen lisinopril (PRINIVIL,ZESTRIL) 10 MG tablet Take 1 tablet (10 mg total) by mouth daily.  . pantoprazole (PROTONIX) 40 MG tablet Take 1 tablet (40 mg total) by mouth daily.  . Vitamin D, Ergocalciferol, (DRISDOL) 50000 units CAPS capsule Take 1 capsule (50,000 Units total) by mouth every 7 (seven) days.   No facility-administered encounter medications on file as of 06/14/2016.      REVIEW OF SYSTEMS  : All other systems reviewed and negative except where noted in the History of Present Illness.   PHYSICAL EXAM: BP 102/78   Pulse 72   Ht 5\' 2"  (1.575 m)   Wt 125 lb (56.7 kg)   BMI 22.86 kg/m  General: Well developed black female in no acute distress Head: Normocephalic and atraumatic Eyes:  Sclerae anicteric, conjunctiva pink. Ears: Normal auditory acuity Lungs: Clear throughout to auscultation Heart: Regular rate and rhythm Abdomen: Soft, non-distended. Normal bowel sounds.  Epigastric TTP. Rectal:  Will be done at the time of colonoscopy. Musculoskeletal: Symmetrical with no gross deformities  Skin: No lesions on visible extremities Extremities: No edema  Neurological: Alert oriented x 4, grossly non-focal Psychological:  Alert and cooperative. Normal mood and affect  ASSESSMENT AND PLAN: -Epigastric abdominal pain with nausea and belching:  Symptoms  constant for the past 2.5 months. This is despite pantoprazole 40 mg daily. We'll schedule her for repeat EGD for evaluation. We'll also increase pantoprazole to 40 mg twice daily. Pending results of EGD she may need some type of imaging such as ultrasound to evaluate for gallbladder issues vs GES. -Screening colonoscopy:  Will schedule with Dr. Hilarie Fredrickson. -Rectal bleeding:  Small amounts of bright red blood.  Likely outlet source.  Will be evaluated with colonoscopy.  *The risks, benefits, and alternatives to EGD and colonoscopy were discussed with the patient and she consents to proceed.   CC:  [name], NP-C   Addendum: Reviewed and agree with initial management. Jerene Bears, MD

## 2016-06-20 ENCOUNTER — Other Ambulatory Visit: Payer: Self-pay | Admitting: Family Medicine

## 2016-06-20 DIAGNOSIS — Z1231 Encounter for screening mammogram for malignant neoplasm of breast: Secondary | ICD-10-CM

## 2016-06-26 ENCOUNTER — Ambulatory Visit (INDEPENDENT_AMBULATORY_CARE_PROVIDER_SITE_OTHER): Payer: BLUE CROSS/BLUE SHIELD | Admitting: Family Medicine

## 2016-06-26 ENCOUNTER — Encounter: Payer: Self-pay | Admitting: Family Medicine

## 2016-06-26 VITALS — BP 130/80 | HR 71 | Wt 129.4 lb

## 2016-06-26 DIAGNOSIS — I10 Essential (primary) hypertension: Secondary | ICD-10-CM | POA: Diagnosis not present

## 2016-06-26 DIAGNOSIS — M25361 Other instability, right knee: Secondary | ICD-10-CM | POA: Diagnosis not present

## 2016-06-26 DIAGNOSIS — E559 Vitamin D deficiency, unspecified: Secondary | ICD-10-CM

## 2016-06-26 MED FILL — ?PANTOPRAZOLE SOD DR 40MG: 40 MG | 30 days supply | Qty: 30 | Fill #3

## 2016-06-26 NOTE — Patient Instructions (Signed)
Keep an eye on your BP at home. If your readings are not <130/80 consistently then call and come in to see me. Otherwise I will see you back in 6 months.  They will call you about the PT for your right knee.   DASH Eating Plan DASH stands for "Dietary Approaches to Stop Hypertension." The DASH eating plan is a healthy eating plan that has been shown to reduce high blood pressure (hypertension). It may also reduce your risk for type 2 diabetes, heart disease, and stroke. The DASH eating plan may also help with weight loss. What are tips for following this plan? General guidelines   Avoid eating more than 2,300 mg (milligrams) of salt (sodium) a day. If you have hypertension, you may need to reduce your sodium intake to 1,500 mg a day.  Limit alcohol intake to no more than 1 drink a day for nonpregnant women and 2 drinks a day for men. One drink equals 12 oz of beer, 5 oz of wine, or 1 oz of hard liquor.  Work with your health care provider to maintain a healthy body weight or to lose weight. Ask what an ideal weight is for you.  Get at least 30 minutes of exercise that causes your heart to beat faster (aerobic exercise) most days of the week. Activities may include walking, swimming, or biking.  Work with your health care provider or diet and nutrition specialist (dietitian) to adjust your eating plan to your individual calorie needs. Reading food labels   Check food labels for the amount of sodium per serving. Choose foods with less than 5 percent of the Daily Value of sodium. Generally, foods with less than 300 mg of sodium per serving fit into this eating plan.  To find whole grains, look for the word "whole" as the first word in the ingredient list. Shopping   Buy products labeled as "low-sodium" or "no salt added."  Buy fresh foods. Avoid canned foods and premade or frozen meals. Cooking   Avoid adding salt when cooking. Use salt-free seasonings or herbs instead of table salt or sea  salt. Check with your health care provider or pharmacist before using salt substitutes.  Do not fry foods. Cook foods using healthy methods such as baking, boiling, grilling, and broiling instead.  Cook with heart-healthy oils, such as olive, canola, soybean, or sunflower oil. Meal planning    Eat a balanced diet that includes:  5 or more servings of fruits and vegetables each day. At each meal, try to fill half of your plate with fruits and vegetables.  Up to 6-8 servings of whole grains each day.  Less than 6 oz of lean meat, poultry, or fish each day. A 3-oz serving of meat is about the same size as a deck of cards. One egg equals 1 oz.  2 servings of low-fat dairy each day.  A serving of nuts, seeds, or beans 5 times each week.  Heart-healthy fats. Healthy fats called Omega-3 fatty acids are found in foods such as flaxseeds and coldwater fish, like sardines, salmon, and mackerel.  Limit how much you eat of the following:  Canned or prepackaged foods.  Food that is high in trans fat, such as fried foods.  Food that is high in saturated fat, such as fatty meat.  Sweets, desserts, sugary drinks, and other foods with added sugar.  Full-fat dairy products.  Do not salt foods before eating.  Try to eat at least 2 vegetarian meals each week.  Eat more home-cooked food and less restaurant, buffet, and fast food.  When eating at a restaurant, ask that your food be prepared with less salt or no salt, if possible. What foods are recommended? The items listed may not be a complete list. Talk with your dietitian about what dietary choices are best for you. Grains  Whole-grain or whole-wheat bread. Whole-grain or whole-wheat pasta. Brown rice. Modena Morrow. Bulgur. Whole-grain and low-sodium cereals. Pita bread. Low-fat, low-sodium crackers. Whole-wheat flour tortillas. Vegetables  Fresh or frozen vegetables (raw, steamed, roasted, or grilled). Low-sodium or reduced-sodium  tomato and vegetable juice. Low-sodium or reduced-sodium tomato sauce and tomato paste. Low-sodium or reduced-sodium canned vegetables. Fruits  All fresh, dried, or frozen fruit. Canned fruit in natural juice (without added sugar). Meat and other protein foods  Skinless chicken or [place]. Ground chicken or [place]. Pork with fat trimmed off. Fish and seafood. Egg whites. Dried beans, peas, or lentils. Unsalted nuts, nut butters, and seeds. Unsalted canned beans. Lean cuts of beef with fat trimmed off. Low-sodium, lean deli meat. Dairy  Low-fat (1%) or fat-free (skim) milk. Fat-free, low-fat, or reduced-fat cheeses. Nonfat, low-sodium ricotta or cottage cheese. Low-fat or nonfat yogurt. Low-fat, low-sodium cheese. Fats and oils  Soft margarine without trans fats. Vegetable oil. Low-fat, reduced-fat, or light mayonnaise and salad dressings (reduced-sodium). Canola, safflower, olive, soybean, and sunflower oils. Avocado. Seasoning and other foods  Herbs. Spices. Seasoning mixes without salt. Unsalted popcorn and pretzels. Fat-free sweets. What foods are not recommended? The items listed may not be a complete list. Talk with your dietitian about what dietary choices are best for you. Grains  Baked goods made with fat, such as croissants, muffins, or some breads. Dry pasta or rice meal packs. Vegetables  Creamed or fried vegetables. Vegetables in a cheese sauce. Regular canned vegetables (not low-sodium or reduced-sodium). Regular canned tomato sauce and paste (not low-sodium or reduced-sodium). Regular tomato and vegetable juice (not low-sodium or reduced-sodium). Angie Fava. Olives. Fruits  Canned fruit in a light or heavy syrup. Fried fruit. Fruit in cream or butter sauce. Meat and other protein foods  Fatty cuts of meat. Ribs. Fried meat. Berniece Salines. Sausage. Bologna and other processed lunch meats. Salami. Fatback. Hotdogs. Bratwurst. Salted nuts and seeds. Canned beans with added salt. Canned or smoked  fish. Whole eggs or egg yolks. Chicken or [place] with skin. Dairy  Whole or 2% milk, cream, and half-and-half. Whole or full-fat cream cheese. Whole-fat or sweetened yogurt. Full-fat cheese. Nondairy creamers. Whipped toppings. Processed cheese and cheese spreads. Fats and oils  Butter. Stick margarine. Lard. Shortening. Ghee. Bacon fat. Tropical oils, such as coconut, palm kernel, or palm oil. Seasoning and other foods  Salted popcorn and pretzels. Onion salt, garlic salt, seasoned salt, table salt, and sea salt. Worcestershire sauce. Tartar sauce. Barbecue sauce. Teriyaki sauce. Soy sauce, including reduced-sodium. Steak sauce. Canned and packaged gravies. Fish sauce. Oyster sauce. Cocktail sauce. Horseradish that you find on the shelf. Ketchup. Mustard. Meat flavorings and tenderizers. Bouillon cubes. Hot sauce and Tabasco sauce. Premade or packaged marinades. Premade or packaged taco seasonings. Relishes. Regular salad dressings. Where to find more information:  National Heart, Lung, and [place]: https://wilson-eaton.com/  American Heart Association: www.heart.org Summary  The DASH eating plan is a healthy eating plan that has been shown to reduce high blood pressure (hypertension). It may also reduce your risk for type 2 diabetes, heart disease, and stroke.  With the DASH eating plan, you should limit salt (sodium) intake to 2,300 mg  a day. If you have hypertension, you may need to reduce your sodium intake to 1,500 mg a day.  When on the DASH eating plan, aim to eat more fresh fruits and vegetables, whole grains, lean proteins, low-fat dairy, and heart-healthy fats.  Work with your health care provider or diet and nutrition specialist (dietitian) to adjust your eating plan to your individual calorie needs. This information is not intended to replace advice given to you by your health care provider. Make sure you discuss any questions you have with your health care provider. Document  Released: 03/08/2011 Document Revised: 03/12/2016 Document Reviewed: 03/12/2016 Elsevier Interactive Patient Education  2017 [employer].

## 2016-06-26 NOTE — Progress Notes (Signed)
   Subjective:    Patient ID: Tina Arroyo, female    DOB: 11/20/1965, 51 y.o.   MRN: [id number]  HPI Chief Complaint  Patient presents with  . follow-up    4 week follow-up on HTN. still passing blood. has an appt on 18th, having knee pain when walking up and down stairs   She is here for a 1 month follow up on HTN. States she was diagnosed with HTN in the early 90s. We increased her lisinopril from 5 mg to 10 mg 4 weeks ago at her establish care visit.   States her BP at home has been mainly  120s/82. Checks it daily. States she is having good results and no issues with her BP medication.  Denies fever, chills, dizziness, cough, chest pain, palpitations, shortness of breath, abdominal pain, N/V/D.   She is taking vitamin D 50,000IU once weekly for vitamin D deficiency. This was prescribed by her previous PCP.   She is now taking PPI twice daily, the dose was increased by her GI. This is helping with her reflux. States April 18th she is scheduled for EDG and colonoscopy.   Complains of right knee issues for the past 2 years.  States she fell and injured her right knee at that time. States she went to the ED and had a negative XR earlier this year.  States her right knee "gave away"  yesterday when she was walking up the steps. States it has given away on several occasions. States she is worried that it might make her fall.  Denies pain or swelling. No numbness, tingling or weakness.   LMP: partial hysterectomy.   Does not smoke.   Reviewed allergies, medications, past medical, surgical, family, and social history.  She will call to schedule her mammogram that is due in April.   Review of Systems Pertinent positives and negatives in the history of present illness.     Objective:   Physical Exam  Constitutional: She appears well-developed and well-nourished. No distress.  Musculoskeletal:       Right knee: Normal. She exhibits normal range of motion, no swelling and no effusion.  No tenderness found.  No laxity. Negative Mcmurrays  Skin: Skin is warm and dry.   BP 130/80   Pulse 71   Wt 129 lb 6.4 oz (58.7 kg)   BMI 23.67 kg/m       Assessment & Plan:  Essential hypertension  Right knee gives way - Plan: Ambulatory referral to Physical Therapy  Vitamin D deficiency - Plan: VITAMIN D 25 Hydroxy (Vit-D Deficiency, Fractures)  Discussed that her BP is within goal range. Continue on current dose of Lisinopril and continue to watch her sodium intake.  She will keep a check of her BP at home and report back if it is not <130/80. Otherwise I will see her back in 6 months for this.  She is taking vitamin D that was initiated by her previous PCP so she will return for a repeat serum vitamin D level after finishing this in 4-5 weeks. She will need a lab visit for this. Order is in.  Her knee exam in unremarkable. She is not having pain. Her only symptom if her knee giving away. Plan to refer her to PT for further evaluation and treatment.  Follow up in 6 months or sooner if needed.

## 2016-07-02 ENCOUNTER — Encounter: Payer: Self-pay | Admitting: Family Medicine

## 2016-07-02 ENCOUNTER — Ambulatory Visit (INDEPENDENT_AMBULATORY_CARE_PROVIDER_SITE_OTHER): Payer: BLUE CROSS/BLUE SHIELD | Admitting: Family Medicine

## 2016-07-02 MED FILL — LISINOPRIL 5 MG TAB: 5 | 30 days supply | Qty: 30 | Fill #3

## 2016-07-02 NOTE — Progress Notes (Signed)
Patient ID: Tina Arroyo, female    DOB: 16-Sep-1965, 51 y.o.   MRN: [id number]  PCP: Molli Barrows, FNP  Chief Complaint  Patient presents with  . Follow-up    6 month  . Medication Refill    lisinopril,protonix  . Knee Pain    right    Subjective:  HPI Erroneous Encounter     Social History   Social History  . Marital status: Married    Spouse name: N/A  . Number of children: 3  . Years of education: N/A   Occupational History  . packer    Social History Main Topics  . Smoking status: Never Smoker  . Smokeless tobacco: Never Used  . Alcohol use No  . Drug use: No  . Sexual activity: Not on file   Other Topics Concern  . Not on file   Social History Narrative  . No narrative on file    Family History  Problem Relation Age of Onset  . Diabetes Mother   . Cancer Brother     throat  . Breast cancer Maternal Grandmother   . Heart disease Maternal Grandmother   . Diabetes Maternal Grandfather   . Breast cancer Maternal Aunt   . Colon cancer Neg Hx   . Stomach cancer Neg Hx   . Rectal cancer Neg Hx   . Esophageal cancer Neg Hx   . Liver cancer Neg Hx      Review of Systems  Patient Active Problem List   Diagnosis Date Noted  . Vitamin D deficiency 06/26/2016  . Rectal bleeding 06/14/2016  . Epigastric pain 06/14/2016  . Special screening for malignant neoplasms, colon 06/14/2016  . Belching 06/14/2016  . Nausea without vomiting 06/14/2016  . Abdominal pain, chronic, right upper quadrant 06/24/2015  . Melena 02/25/2015  . Uncontrolled stage 2 hypertension 02/25/2015  . HTN (hypertension) 02/23/2015  . Migraine 02/23/2015  . Upper GI bleed 02/23/2015    No Known Allergies  Prior to Admission medications   Medication Sig Start Date End Date Taking? Authorizing Provider  DUEXIS 800-26.6 MG TABS Take 1 tablet by mouth 2 (two) times daily.  05/25/16   Historical Provider, MD  lisinopril (PRINIVIL,ZESTRIL) 10 MG tablet Take 1 tablet (10 mg  total) by mouth daily. 05/29/16   Vickie L Henson, NP-C  Na Sulfate-K Sulfate-Mg Sulf (SUPREP BOWEL PREP KIT) 17.5-3.13-1.6 GM/180ML SOLN Take 1 kit by mouth as directed. 06/14/16   Jessica D Zehr, PA-C  pantoprazole (PROTONIX) 40 MG tablet Take 1 tablet (40 mg total) by mouth 2 (two) times daily. 06/14/16   Laban Emperor Zehr, PA-C  Vitamin D, Ergocalciferol, (DRISDOL) 50000 units CAPS capsule Take 1 capsule (50,000 Units total) by mouth every 7 (seven) days. 12/30/15   Micheline Chapman, NP    Past Medical, Surgical Family and Social History reviewed and updated.    Objective:   Today's Vitals   07/02/16 1050  BP: (!) 150/80  Pulse: 82  Resp: 18  Temp: 97.6 F (36.4 C)  TempSrc: Oral  SpO2: 100%  Weight: 125 lb (56.7 kg)  Height: 5' 2"  (1.575 m)    Wt Readings from Last 3 Encounters:  07/02/16 125 lb (56.7 kg)  06/26/16 129 lb 6.4 oz (58.7 kg)  06/14/16 125 lb (56.7 kg)    Physical Exam         Assessment & Plan:  There are no diagnoses linked to this encounter.    Carroll Sage. Kenton Kingfisher, MSN, FNP-C [facility] [address] [place], [place] [address] [phone]

## 2016-07-04 ENCOUNTER — Encounter: Payer: Self-pay | Admitting: Internal Medicine

## 2016-07-05 ENCOUNTER — Telehealth: Payer: Self-pay | Admitting: Internal Medicine

## 2016-07-05 NOTE — Telephone Encounter (Signed)
Patient has been advised that a sample prep has been placed at the front desk for her to pick up at our front desk. She verbalizes understanding.

## 2016-07-09 MED FILL — PANTOPRAZOLE SOD DR 40 MG T: 40 | 30 days supply | Qty: 60 | Fill #0

## 2016-07-17 ENCOUNTER — Encounter: Payer: Self-pay | Admitting: Internal Medicine

## 2016-07-18 ENCOUNTER — Encounter: Payer: Self-pay | Admitting: Internal Medicine

## 2016-07-18 ENCOUNTER — Ambulatory Visit (AMBULATORY_SURGERY_CENTER): Payer: BLUE CROSS/BLUE SHIELD | Admitting: Internal Medicine

## 2016-07-18 VITALS — BP 151/115 | HR 86 | Temp 99.3°F | Resp 13 | Ht 62.0 in | Wt 129.0 lb

## 2016-07-18 DIAGNOSIS — Z1212 Encounter for screening for malignant neoplasm of rectum: Secondary | ICD-10-CM

## 2016-07-18 DIAGNOSIS — B3781 Candidal esophagitis: Secondary | ICD-10-CM

## 2016-07-18 DIAGNOSIS — R1013 Epigastric pain: Secondary | ICD-10-CM | POA: Diagnosis not present

## 2016-07-18 DIAGNOSIS — K635 Polyp of colon: Secondary | ICD-10-CM

## 2016-07-18 DIAGNOSIS — D123 Benign neoplasm of transverse colon: Secondary | ICD-10-CM | POA: Diagnosis not present

## 2016-07-18 DIAGNOSIS — Z1211 Encounter for screening for malignant neoplasm of colon: Secondary | ICD-10-CM

## 2016-07-18 DIAGNOSIS — D125 Benign neoplasm of sigmoid colon: Secondary | ICD-10-CM

## 2016-07-18 DIAGNOSIS — K2950 Unspecified chronic gastritis without bleeding: Secondary | ICD-10-CM | POA: Diagnosis not present

## 2016-07-18 DIAGNOSIS — K2970 Gastritis, unspecified, without bleeding: Secondary | ICD-10-CM

## 2016-07-18 MED ORDER — SODIUM CHLORIDE 0.9 % IV SOLN: 500.0000 mL | INTRAVENOUS | Status: DC

## 2016-07-18 MED ORDER — FLUCONAZOLE 100 MG PO TABS: 100.0000 mg | ORAL_TABLET | Freq: Every day | ORAL | 0 refills | Status: DC

## 2016-07-18 NOTE — Op Note (Signed)
[facility] Patient Name: Tina Arroyo Procedure Date: 07/18/2016 4:12 PM MRN: [id number] Endoscopist: Jerene Bears , MD Age: 51 Referring MD:  Date of Birth: October 07, 1965 Gender: Female Account #: [id number] Procedure:                Colonoscopy Indications:              Screening for colorectal malignant neoplasm, This                            is the patient's first colonoscopy Medicines:                Monitored Anesthesia Care Procedure:                Pre-Anesthesia Assessment:                           - Prior to the procedure, a History and Physical                            was performed, and patient medications and                            allergies were reviewed. The patient's tolerance of                            previous anesthesia was also reviewed. The risks                            and benefits of the procedure and the sedation                            options and risks were discussed with the patient.                            All questions were answered, and informed consent                            was obtained. Prior Anticoagulants: The patient has                            taken no previous anticoagulant or antiplatelet                            agents. ASA Grade Assessment: II - A patient with                            mild systemic disease. After reviewing the risks                            and benefits, the patient was deemed in                            satisfactory condition to undergo the procedure.  After obtaining informed consent, the colonoscope                            was passed under direct vision. Throughout the                            procedure, the patient's blood pressure, pulse, and                            oxygen saturations were monitored continuously. The                            Colonoscope was introduced through the anus and                            advanced to the the cecum,  identified by                            appendiceal orifice and ileocecal valve. The                            colonoscopy was performed without difficulty. The                            patient tolerated the procedure well. The quality                            of the bowel preparation was good. The ileocecal                            valve, appendiceal orifice, and rectum were                            photographed. Scope In: 4:24:04 PM Scope Out: 4:41:51 PM Scope Withdrawal Time: 0 hours 12 minutes 14 seconds  Total Procedure Duration: 0 hours 17 minutes 47 seconds  Findings:                 The digital rectal exam was normal.                           A 5 mm polyp was found in the hepatic flexure. The                            polyp was sessile. The polyp was removed with a                            cold snare. Resection and retrieval were complete.                           A 5 mm polyp was found in the transverse colon. The                            polyp was sessile. The polyp was  removed with a                            cold snare. Resection and retrieval were complete.                           A 4 mm polyp was found in the sigmoid colon. The                            polyp was sessile. The polyp was removed with a                            cold snare. Resection and retrieval were complete.                           Internal hemorrhoids were found during                            retroflexion. The hemorrhoids were medium-sized. Complications:            No immediate complications. Estimated Blood Loss:     Estimated blood loss was minimal. Impression:               - One 5 mm polyp at the hepatic flexure, removed                            with a cold snare. Resected and retrieved.                           - One 5 mm polyp in the transverse colon, removed                            with a cold snare. Resected and retrieved.                           - One 4 mm  polyp in the sigmoid colon, removed with                            a cold snare. Resected and retrieved.                           - Internal hemorrhoids. Recommendation:           - Patient has a contact number available for                            emergencies. The signs and symptoms of potential                            delayed complications were discussed with the                            patient. Return to normal activities tomorrow.  Written discharge instructions were provided to the                            patient.                           - Resume previous diet.                           - Continue present medications.                           - Await pathology results.                           - Repeat colonoscopy is recommended for                            surveillance. The colonoscopy date will be                            determined after pathology results from today's                            exam become available for review. Jerene Bears, MD 07/18/2016 4:51:17 PM This report has been signed electronically.

## 2016-07-18 NOTE — Progress Notes (Signed)
Pt's states no medical or surgical changes since previsit or office visit. 

## 2016-07-18 NOTE — Progress Notes (Signed)
Called to room to assist during endoscopic procedure.  Patient ID and intended procedure confirmed with present staff. Received instructions for my participation in the procedure from the performing physician.  

## 2016-07-18 NOTE — Progress Notes (Signed)
To recovery, report to Oliver, RN, VSS 

## 2016-07-18 NOTE — Patient Instructions (Signed)
Handouts given on polyps, hemorrhoids and gastritis   YOU HAD AN ENDOSCOPIC PROCEDURE TODAY: Refer to the procedure report and other information in the discharge instructions given to you for any specific questions about what was found during the examination. If this information does not answer your questions, please call [facility] office at [phone] to clarify.   YOU SHOULD EXPECT: Some feelings of bloating in the abdomen. Passage of more gas than usual. Walking can help get rid of the air that was put into your GI tract during the procedure and reduce the bloating. If you had a lower endoscopy (such as a colonoscopy or flexible sigmoidoscopy) you may notice spotting of blood in your stool or on the toilet paper. Some abdominal soreness may be present for a day or two, also.  DIET: Your first meal following the procedure should be a light meal and then it is ok to progress to your normal diet. A half-sandwich or bowl of soup is an example of a good first meal. Heavy or fried foods are harder to digest and may make you feel nauseous or bloated. Drink plenty of fluids but you should avoid alcoholic beverages for 24 hours. If you had a esophageal dilation, please see attached instructions for diet.    ACTIVITY: Your care partner should take you home directly after the procedure. You should plan to take it easy, moving slowly for the rest of the day. You can resume normal activity the day after the procedure however YOU SHOULD NOT DRIVE, use power tools, machinery or perform tasks that involve climbing or major physical exertion for 24 hours (because of the sedation medicines used during the test).   SYMPTOMS TO REPORT IMMEDIATELY: A gastroenterologist can be reached at any hour. Please call [phone]  for any of the following symptoms:  Following lower endoscopy (colonoscopy, flexible sigmoidoscopy) Excessive amounts of blood in the stool  Significant tenderness, worsening of abdominal pains   Swelling of the abdomen that is new, acute  Fever of 100 or higher  Following upper endoscopy (EGD, EUS, ERCP, esophageal dilation) Vomiting of blood or coffee ground material  New, significant abdominal pain  New, significant chest pain or pain under the shoulder blades  Painful or persistently difficult swallowing  New shortness of breath  Black, tarry-looking or red, bloody stools  FOLLOW UP:  If any biopsies were taken you will be contacted by phone or by letter within the next 1-3 weeks. Call [phone]  if you have not heard about the biopsies in 3 weeks.  Please also call with any specific questions about appointments or follow up tests.

## 2016-07-18 NOTE — Op Note (Addendum)
[facility] Patient Name: Tina Arroyo Procedure Date: 07/18/2016 4:12 PM MRN: [id number] Endoscopist: Jerene Bears , MD Age: 51 Referring MD:  Date of Birth: 12-Oct-1965 Gender: Female Account #: [id number] Procedure:                Upper GI endoscopy Indications:              Epigastric abdominal pain, Nausea Medicines:                Monitored Anesthesia Care Procedure:                Pre-Anesthesia Assessment:                           - Prior to the procedure, a History and Physical                            was performed, and patient medications and                            allergies were reviewed. The patient's tolerance of                            previous anesthesia was also reviewed. The risks                            and benefits of the procedure and the sedation                            options and risks were discussed with the patient.                            All questions were answered, and informed consent                            was obtained. Prior Anticoagulants: The patient has                            taken no previous anticoagulant or antiplatelet                            agents. ASA Grade Assessment: II - A patient with                            mild systemic disease. After reviewing the risks                            and benefits, the patient was deemed in                            satisfactory condition to undergo the procedure.                           After obtaining informed consent, the endoscope was  passed under direct vision. Throughout the                            procedure, the patient's blood pressure, pulse, and                            oxygen saturations were monitored continuously. The                            Model GIF-[id number]) scope was introduced                            through the mouth, and advanced to the second part                            of duodenum. The  upper GI endoscopy was                            accomplished without difficulty. The patient                            tolerated the procedure well. Scope In: Scope Out: Findings:                 Patchy candidiasis was found in the middle third of                            the esophagus and in the lower third of the                            esophagus.                           The exam of the esophagus was otherwise normal.                           The cardia and gastric fundus were normal on                            retroflexion.                           Moderate inflammation characterized by erythema and                            linear erosions was found in the gastric body and                            in the prepyloric region of the stomach. Biopsies                            were taken with a cold forceps for histology and                            Helicobacter pylori testing.  The examined duodenum was normal. Complications:            No immediate complications. Estimated Blood Loss:     Estimated blood loss was minimal. Impression:               - Monilial esophagitis.                           - Gastritis. Biopsied.                           - Normal examined duodenum. Recommendation:           - Patient has a contact number available for                            emergencies. The signs and symptoms of potential                            delayed complications were discussed with the                            patient. Return to normal activities tomorrow.                            Written discharge instructions were provided to the                            patient.                           - Resume previous diet.                           - Continue present medications.                           - Fluconazole 200 mg x 1 day, 100 mg x 13 days for                            Candida esophagitis.                           - Await  pathology results.                           - See the other procedure note for documentation of                            additional recommendations. Jerene Bears, MD 07/18/2016 4:47:39 PM This report has been signed electronically.

## 2016-07-19 ENCOUNTER — Telehealth: Payer: Self-pay

## 2016-07-19 ENCOUNTER — Telehealth: Payer: Self-pay | Admitting: *Deleted

## 2016-07-19 NOTE — Telephone Encounter (Signed)
-----   Message from Jerene Bears, MD sent at 07/18/2016  5:00 PM EDT ----- Regarding: hemorrhoids Banding visits please JMP

## 2016-07-19 NOTE — Telephone Encounter (Signed)
  Follow up Call-  Call back number 07/18/2016  Post procedure Call Back phone  # [phone]  Permission to leave phone message Yes  Some recent data might be hidden     Patient questions:  Do you have a fever, pain , or abdominal swelling? No. Pain Score  0 *  Have you tolerated food without any problems? Yes.    Have you been able to return to your normal activities? Yes.    Do you have any questions about your discharge instructions: Diet   No. Medications  No. Follow up visit  No.  Do you have questions or concerns about your Care? No.  Actions: * If pain score is 4 or above: No action needed, pain <4.

## 2016-07-19 NOTE — Telephone Encounter (Signed)
Patient has been scheduled for hemorrhoidal bandings x 3. She has been advised of all 3 of these banding times and dates. She verbalizes understanding of each of these.

## 2016-07-20 ENCOUNTER — Encounter: Payer: Self-pay | Admitting: *Deleted

## 2016-07-23 ENCOUNTER — Ambulatory Visit
Admission: RE | Admit: 2016-07-23 | Discharge: 2016-07-23 | Disposition: A | Discharge status: Home / Self Care | Payer: BLUE CROSS/BLUE SHIELD | Source: Ambulatory Visit | Attending: Family Medicine | Admitting: Family Medicine

## 2016-07-23 DIAGNOSIS — Z1231 Encounter for screening mammogram for malignant neoplasm of breast: Secondary | ICD-10-CM

## 2016-07-24 ENCOUNTER — Encounter: Payer: Self-pay | Admitting: Internal Medicine

## 2016-07-24 ENCOUNTER — Ambulatory Visit (INDEPENDENT_AMBULATORY_CARE_PROVIDER_SITE_OTHER): Payer: BLUE CROSS/BLUE SHIELD | Admitting: Internal Medicine

## 2016-07-24 VITALS — BP 140/80 | HR 74 | Ht 62.0 in | Wt 125.2 lb

## 2016-07-24 DIAGNOSIS — K648 Other hemorrhoids: Secondary | ICD-10-CM

## 2016-07-24 NOTE — Patient Instructions (Addendum)
You have been scheduled for your 2nd hemorrhoidal banding on Wednesday, 08/22/16 @ 3:30 pm.  Take miralax 17 grams (1capful) daily.  HEMORRHOID BANDING PROCEDURE    FOLLOW-UP CARE   1. The procedure you have had should have been relatively painless since the banding of the area involved does not have nerve endings and there is no pain sensation.  The rubber band cuts off the blood supply to the hemorrhoid and the band may fall off as soon as 48 hours after the banding (the band may occasionally be seen in the toilet bowl following a bowel movement). You may notice a temporary feeling of fullness in the rectum which should respond adequately to plain Tylenol or Motrin.  2. Following the banding, avoid strenuous exercise that evening and resume full activity the next day.  A sitz bath (soaking in a warm tub) or bidet is soothing, and can be useful for cleansing the area after bowel movements.     3. To avoid constipation, take two tablespoons of natural wheat bran, natural oat bran, flax, Benefiber or any over the counter fiber supplement and increase your water intake to 7-8 glasses daily.    4. Unless you have been prescribed anorectal medication, do not put anything inside your rectum for two weeks: No suppositories, enemas, fingers, etc.  5. Occasionally, you may have more bleeding than usual after the banding procedure.  This is often from the untreated hemorrhoids rather than the treated one.  Don't be concerned if there is a tablespoon or so of blood.  If there is more blood than this, lie flat with your bottom higher than your head and apply an ice pack to the area. If the bleeding does not stop within a half an hour or if you feel faint, call our office at [phone] or go to the emergency room.  6. Problems are not common; however, if there is a substantial amount of bleeding, severe pain, chills, fever or difficulty passing urine (very rare) or other problems, you should call us  at [phone] or report to the nearest emergency room.  7. Do not stay seated continuously for more than 2-3 hours for a day or two after the procedure.  Tighten your buttock muscles 10-15 times every two hours and take 10-15 deep breaths every 1-2 hours.  Do not spend more than a few minutes on the toilet if you cannot empty your bowel; instead re-visit the toilet at a later time.

## 2016-07-24 NOTE — Progress Notes (Signed)
Tina Arroyo is a 51 year old seen for symptomatic internal hemorrhoids. She had a very recent colonoscopy performed on 07/18/2016. This revealed 3 polyps which were removed with cold snare., There were medium-sized internal hemorrhoids seen on retroflexion. These hemorrhoids were benign though one was adenomatous  She reports her hemorrhoidal symptoms have been present for many years. They were treated several years ago by "laser" procedure which was very painful when performed in another office in [place], [place]. She does not feel that this was a banding procedure. She has some constipation having a bowel movement about 3 days per week. She has bleeding with nearly every bowel movement as well as perianal itching. She denies prolapse and fecal smearing.   PROCEDURE NOTE:  The patient presents with symptomatic grade 2 internal hemorrhoids, requesting rubber band ligation of her hemorrhoidal disease.  All risks, benefits and alternative forms of therapy were described and informed consent was obtained.   The anorectum was pre-medicated with 0.125% nitroglycerin ointment The decision was made to band the LL internal hemorrhoid, and the [place] was used to perform band ligation without complication.   Digital anorectal examination was then performed to assure proper positioning of the band, and to adjust the banded tissue as required.  The patient was discharged home without pain or other issues.  Dietary and behavioral recommendations were given and along with follow-up instructions.     The following adjunctive treatments were recommended: Add MiraLAX 17 g a day for mild constipation and to avoid hard stools just her beta hemorrhoids  The patient will return as scheduled for  follow-up and possible additional banding as required. No complications were encountered and the patient tolerated the procedure well.

## 2016-07-31 ENCOUNTER — Other Ambulatory Visit: Payer: BLUE CROSS/BLUE SHIELD

## 2016-07-31 DIAGNOSIS — E559 Vitamin D deficiency, unspecified: Secondary | ICD-10-CM

## 2016-08-01 LAB — VITAMIN D 25 HYDROXY (VIT D DEFICIENCY, FRACTURES): Vit D, 25-Hydroxy: 42 ng/mL (ref 30–100)

## 2016-08-09 MED FILL — LISINOPRIL 5 MG TAB: 5 | 30 days supply | Qty: 30 | Fill #4

## 2016-08-15 ENCOUNTER — Encounter: Payer: Self-pay | Admitting: Internal Medicine

## 2016-08-22 ENCOUNTER — Ambulatory Visit (INDEPENDENT_AMBULATORY_CARE_PROVIDER_SITE_OTHER): Payer: BLUE CROSS/BLUE SHIELD | Admitting: Internal Medicine

## 2016-08-22 ENCOUNTER — Encounter: Payer: Self-pay | Admitting: Internal Medicine

## 2016-08-22 VITALS — BP 160/92 | HR 88 | Ht 63.0 in | Wt 122.4 lb

## 2016-08-22 DIAGNOSIS — K648 Other hemorrhoids: Secondary | ICD-10-CM

## 2016-08-22 NOTE — Patient Instructions (Signed)
You have been scheduled for your 3rd hemorrhoidal banding on 09/12/16 @ 3:30 pm.  HEMORRHOID BANDING PROCEDURE    FOLLOW-UP CARE   1. The procedure you have had should have been relatively painless since the banding of the area involved does not have nerve endings and there is no pain sensation.  The rubber band cuts off the blood supply to the hemorrhoid and the band may fall off as soon as 48 hours after the banding (the band may occasionally be seen in the toilet bowl following a bowel movement). You may notice a temporary feeling of fullness in the rectum which should respond adequately to plain Tylenol or Motrin.  2. Following the banding, avoid strenuous exercise that evening and resume full activity the next day.  A sitz bath (soaking in a warm tub) or bidet is soothing, and can be useful for cleansing the area after bowel movements.     3. To avoid constipation, take two tablespoons of natural wheat bran, natural oat bran, flax, Benefiber or any over the counter fiber supplement and increase your water intake to 7-8 glasses daily.    4. Unless you have been prescribed anorectal medication, do not put anything inside your rectum for two weeks: No suppositories, enemas, fingers, etc.  5. Occasionally, you may have more bleeding than usual after the banding procedure.  This is often from the untreated hemorrhoids rather than the treated one.  Don't be concerned if there is a tablespoon or so of blood.  If there is more blood than this, lie flat with your bottom higher than your head and apply an ice pack to the area. If the bleeding does not stop within a half an hour or if you feel faint, call our office at [phone] or go to the emergency room.  6. Problems are not common; however, if there is a substantial amount of bleeding, severe pain, chills, fever or difficulty passing urine (very rare) or other problems, you should call us at [phone] or report to the nearest emergency  room.  7. Do not stay seated continuously for more than 2-3 hours for a day or two after the procedure.  Tighten your buttock muscles 10-15 times every two hours and take 10-15 deep breaths every 1-2 hours.  Do not spend more than a few minutes on the toilet if you cannot empty your bowel; instead re-visit the toilet at a later time.

## 2016-08-22 NOTE — Progress Notes (Signed)
Tina Arroyo is a 51 year old female symptomatic internal hemorrhoids. She had initial hemorrhoidal banding on 07/24/2016 Symptoms prior to banding were rectal bleeding with nearly every bowel movement and perianal itching. She reports rectal bleeding has improved even with initial banding She began Metamucil and this has led to improvement in constipation now having about one bowel movement per day   PROCEDURE NOTE:  The patient presents with symptomatic grade 2 internal hemorrhoids, requesting rubber band ligation of her hemorrhoidal disease.  All risks, benefits and alternative forms of therapy were described and informed consent was obtained.   The anorectum was pre-medicated with 0.125% nitroglycerin ointment The decision was made to band the right anterior internal hemorrhoid (LL band #1), and the [place] was used to perform band ligation without complication.   Digital anorectal examination was then performed to assure proper positioning of the band, and to adjust the banded tissue as required.  The patient was discharged home without pain or other issues.  Dietary and behavioral recommendations were given and along with follow-up instructions.     The following adjunctive treatments were recommended: Continue Metamucil daily  The patient will return as scheduled for  follow-up and possible additional banding as required. No complications were encountered and the patient tolerated the procedure well.

## 2016-08-28 ENCOUNTER — Emergency Department (HOSPITAL_COMMUNITY)
Admission: EM | Admit: 2016-08-28 | Discharge: 2016-08-28 | Disposition: A | Discharge status: Home / Self Care | Payer: BLUE CROSS/BLUE SHIELD | Attending: Emergency Medicine | Admitting: Emergency Medicine

## 2016-08-28 ENCOUNTER — Encounter (HOSPITAL_COMMUNITY): Payer: Self-pay | Admitting: Emergency Medicine

## 2016-08-28 DIAGNOSIS — Y939 Activity, unspecified: Secondary | ICD-10-CM | POA: Diagnosis not present

## 2016-08-28 DIAGNOSIS — I10 Essential (primary) hypertension: Secondary | ICD-10-CM | POA: Diagnosis not present

## 2016-08-28 DIAGNOSIS — M546 Pain in thoracic spine: Secondary | ICD-10-CM

## 2016-08-28 DIAGNOSIS — S299XXA Unspecified injury of thorax, initial encounter: Secondary | ICD-10-CM | POA: Diagnosis present

## 2016-08-28 DIAGNOSIS — Y92410 Unspecified street and highway as the place of occurrence of the external cause: Secondary | ICD-10-CM | POA: Insufficient documentation

## 2016-08-28 DIAGNOSIS — Z79899 Other long term (current) drug therapy: Secondary | ICD-10-CM | POA: Diagnosis not present

## 2016-08-28 DIAGNOSIS — Y999 Unspecified external cause status: Secondary | ICD-10-CM | POA: Insufficient documentation

## 2016-08-28 MED ORDER — NAPROXEN 500 MG PO TABS
500.0000 mg | ORAL_TABLET | Freq: Two times a day (BID) | ORAL | 0 refills | Status: DC
Start: 2016-08-28 — End: 2020-09-05

## 2016-08-28 MED ORDER — METHOCARBAMOL 500 MG PO TABS: 500.0000 mg | ORAL_TABLET | Freq: Two times a day (BID) | ORAL | 0 refills | Status: DC
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
## 2016-08-28 NOTE — Discharge Instructions (Signed)
Take your medications as prescribed. I also recommend applying ice and/or heat to affected area for 15-20 minutes 3-4 times daily for additional pain relief. Refrain from doing any heavy lifting, squatting or repetitive movements that exacerbate your symptoms. Follow-up with your primary care provider in the next week if her symptoms have not improved.  Please return to the Emergency Department if symptoms worsen or new onset of fever, numbness, tingling, groin anesthesia, loss of bowel or bladder, weakness.
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
## 2016-08-31 ENCOUNTER — Encounter: Payer: Self-pay | Admitting: Family Medicine

## 2016-08-31 ENCOUNTER — Ambulatory Visit
Admission: RE | Admit: 2016-08-31 | Discharge: 2016-08-31 | Disposition: A | Discharge status: Home / Self Care | Payer: BLUE CROSS/BLUE SHIELD | Source: Ambulatory Visit | Attending: Family Medicine | Admitting: Family Medicine

## 2016-08-31 ENCOUNTER — Ambulatory Visit (INDEPENDENT_AMBULATORY_CARE_PROVIDER_SITE_OTHER): Payer: BLUE CROSS/BLUE SHIELD | Admitting: Family Medicine

## 2016-08-31 VITALS — BP 140/90 | HR 74 | Temp 98.3°F | Wt 127.2 lb

## 2016-08-31 DIAGNOSIS — M542 Cervicalgia: Secondary | ICD-10-CM

## 2016-08-31 DIAGNOSIS — M549 Dorsalgia, unspecified: Secondary | ICD-10-CM | POA: Diagnosis not present

## 2016-08-31 NOTE — Patient Instructions (Addendum)
We will call you with your XR results. Use heat to the area and continue to take the Naproxen twice daily with food as prescribed for the next 2-3 days only. Continue taking the Protonix. You may also use the Robaxin if this helps your pain.
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
## 2016-09-12 ENCOUNTER — Encounter: Payer: Self-pay | Admitting: Internal Medicine

## 2016-09-12 ENCOUNTER — Ambulatory Visit (INDEPENDENT_AMBULATORY_CARE_PROVIDER_SITE_OTHER): Payer: BLUE CROSS/BLUE SHIELD | Admitting: Internal Medicine

## 2016-09-12 VITALS — BP 116/80 | HR 84 | Ht 62.0 in | Wt 121.6 lb

## 2016-09-12 DIAGNOSIS — K648 Other hemorrhoids: Secondary | ICD-10-CM | POA: Diagnosis not present

## 2016-09-12 NOTE — Progress Notes (Signed)
Tina Arroyo is a 51 year old female history of symptomatic internal hemorrhoids Now status post temporal banding 2 Symptoms prior to banding were rectal bleeding with nearly every bowel movement as well as perianal itching Symptoms have improved tremendously with internal hemorrhoidal banding   PROCEDURE NOTE:  The patient presents with symptomatic grade 2 internal hemorrhoids, requesting rubber band ligation of her hemorrhoidal disease.  All risks, benefits and alternative forms of therapy were described and informed consent was obtained.   The anorectum was pre-medicated with 0.125% nitroglycerin ointment The decision was made to band the right posterior (right anterior and left lateral banded previously) internal hemorrhoid, and the [place] was used to perform band ligation without complication.   Digital anorectal examination was then performed to assure proper positioning of the band, and to adjust the banded tissue as required.  The patient was discharged home without pain or other issues.  Dietary and behavioral recommendations were given and along with follow-up instructions.     The following adjunctive treatments were recommended: Continue Metamucil daily  The patient will return as needed for follow-up and possible additional banding as required. No complications were encountered and the patient tolerated the procedure well.

## 2016-09-12 NOTE — Patient Instructions (Signed)
Continue metamucil.  Follow up with Dr Hilarie Fredrickson as needed.  If you are age 50 or older, your body mass index should be between 23-30. Your Body mass index is 22.24 kg/m. If this is out of the aforementioned range listed, please consider follow up with your Primary Care Provider.  If you are age 66 or younger, your body mass index should be between 19-25. Your Body mass index is 22.24 kg/m. If this is out of the aformentioned range listed, please consider follow up with your Primary Care Provider.

## 2016-12-24 ENCOUNTER — Encounter: Payer: BLUE CROSS/BLUE SHIELD | Admitting: Family Medicine

## 2017-09-05 LAB — HM PAP SMEAR

## 2019-05-05 ENCOUNTER — Emergency Department (HOSPITAL_COMMUNITY)
Admission: EM | Admit: 2019-05-05 | Discharge: 2019-05-05 | Disposition: A | Discharge status: Home / Self Care | Payer: Self-pay | Attending: Emergency Medicine | Admitting: Emergency Medicine

## 2019-05-05 ENCOUNTER — Emergency Department (HOSPITAL_COMMUNITY): Payer: Self-pay

## 2019-05-05 ENCOUNTER — Encounter (HOSPITAL_COMMUNITY): Payer: Self-pay | Admitting: Emergency Medicine

## 2019-05-05 ENCOUNTER — Other Ambulatory Visit: Payer: Self-pay

## 2019-05-05 DIAGNOSIS — Y990 Civilian activity done for income or pay: Secondary | ICD-10-CM | POA: Insufficient documentation

## 2019-05-05 DIAGNOSIS — Y9389 Activity, other specified: Secondary | ICD-10-CM | POA: Insufficient documentation

## 2019-05-05 DIAGNOSIS — Y9289 Other specified places as the place of occurrence of the external cause: Secondary | ICD-10-CM | POA: Insufficient documentation

## 2019-05-05 DIAGNOSIS — S8392XA Sprain of unspecified site of left knee, initial encounter: Secondary | ICD-10-CM | POA: Insufficient documentation

## 2019-05-05 DIAGNOSIS — W208XXA Other cause of strike by thrown, projected or falling object, initial encounter: Secondary | ICD-10-CM | POA: Insufficient documentation

## 2019-05-05 DIAGNOSIS — M25462 Effusion, left knee: Secondary | ICD-10-CM | POA: Insufficient documentation

## 2019-05-05 DIAGNOSIS — Z79899 Other long term (current) drug therapy: Secondary | ICD-10-CM | POA: Insufficient documentation

## 2019-05-05 DIAGNOSIS — I10 Essential (primary) hypertension: Secondary | ICD-10-CM | POA: Insufficient documentation

## 2019-05-05 NOTE — ED Provider Notes (Signed)
[facility] EMERGENCY DEPARTMENT Provider Note   CSN: [id number] Arrival date & time: 05/05/19  1538     History Chief Complaint  Patient presents with  . Knee Injury    Tina Arroyo is a 54 y.o. female who presents for evaluation of left knee pain that began yesterday.  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  She states that she has had history of ligament repair, though she does not recall which ligament, a few years ago.  She states that throughout the day, the area started becoming more painful, bruised and swollen.  She has been able to ambulate on the leg but does report worsening pain when doing so.  She states that she has not taken anything for pain.  She denies any hip pain, ankle pain.  She denies any numbness/weakness.  The history is provided by the patient.       Past Medical History:  Diagnosis Date  . Anemia   . Bleeding stomach ulcer 2016  . Candida esophagitis ([place])   . Hypertension   . Internal hemorrhoids   . Migraines     Patient Active Problem List   Diagnosis Date Noted  . Vitamin D deficiency 06/26/2016  . Rectal bleeding 06/14/2016  . Epigastric pain 06/14/2016  . Special screening for malignant neoplasms, colon 06/14/2016  . Belching 06/14/2016  . Nausea without vomiting 06/14/2016  . Abdominal pain, chronic, right upper quadrant 06/24/2015  . Melena 02/25/2015  . Uncontrolled stage 2 hypertension 02/25/2015  . HTN (hypertension) 02/23/2015  . Migraine 02/23/2015  . Upper GI bleed 02/23/2015    Past Surgical History:  Procedure Laterality Date  . ABDOMINAL HYSTERECTOMY    . ESOPHAGOGASTRODUODENOSCOPY N/A 02/25/2015   Procedure: ESOPHAGOGASTRODUODENOSCOPY (EGD);  Surgeon: Wilford Corner, MD;  Location: [facility];  Service: Endoscopy;  Laterality: N/A;  . FOOT SURGERY    . knee     left "ligament got tore up" surgery  .  MULTIPLE TOOTH EXTRACTIONS    . TUBAL LIGATION       OB History   No obstetric history on file.     Family History  Problem Relation Age of Onset  . Diabetes Mother   . Cancer Brother        throat  . Breast cancer Maternal Grandmother   . Heart disease Maternal Grandmother   . Diabetes Maternal Grandfather   . Breast cancer Maternal Aunt   . Colon cancer Neg Hx   . Stomach cancer Neg Hx   . Rectal cancer Neg Hx   . Esophageal cancer Neg Hx   . Liver cancer Neg Hx     Social History   Tobacco Use  . Smoking status: Never Smoker  . Smokeless tobacco: Never Used  Substance Use Topics  . Alcohol use: No  . Drug use: No    Home Medications Prior to Admission medications   Medication Sig Start Date End Date Taking? Authorizing Provider  DUEXIS 800-26.6 MG TABS Take 1 tablet by mouth 2 (two) times daily.  05/25/16   [provider]  lisinopril (PRINIVIL,ZESTRIL) 10 MG tablet Take 1 tablet (10 mg total) by mouth daily. 05/29/16   Henson, Vickie L, NP-C  methocarbamol (ROBAXIN) 500 MG tablet Take 1 tablet (500 mg total) by mouth 2 (two) times daily. 08/28/16   Nona Dell, PA-C  naproxen (NAPROSYN) 500 MG  tablet Take 1 tablet (500 mg total) by mouth 2 (two) times daily. 08/28/16   Nona Dell, PA-C  pantoprazole (PROTONIX) 40 MG tablet Take 1 tablet (40 mg total) by mouth 2 (two) times daily. 06/14/16   Zehr, Laban Emperor, PA-C    Allergies    Tuberculin tests  Review of Systems   Review of Systems  Musculoskeletal:       Knee pain  Neurological: Negative for weakness and numbness.  All other systems reviewed and are negative.   Physical Exam Updated Vital Signs BP 139/89 (BP Location: Right Arm)   Pulse 77   Temp 99.1 F (37.3 C) (Oral)   Resp 16   SpO2 96%   Physical Exam Vitals and nursing note reviewed.  Constitutional:      Appearance: She is well-developed.  HENT:     Head: Normocephalic and atraumatic.  Eyes:      General: No scleral icterus.       Right eye: No discharge.        Left eye: No discharge.     Conjunctiva/sclera: Conjunctivae normal.  Cardiovascular:     Pulses:          Dorsalis pedis pulses are 2+ on the right side and 2+ on the left side.  Pulmonary:     Effort: Pulmonary effort is normal.  Musculoskeletal:     Comments: Tenderness palpation noted to the lateral aspect of the left knee with overlying soft tissue swelling and ecchymosis.  No bony deformity or crepitus noted.  No bony tenderness noted to the right hip, right femur, right tib-fib, right ankle.  Extension intact.  She can hold the leg in extension against gravity.  Limited flexion secondary to pain.  No instability noted with varus or valgus stress.  Negative anterior and posterior drawer test though she does have some pain with doing so.  No tenderness palpation noted to right lower extremity.  Full range of motion of right lower extremity intact with any difficulty.  Skin:    General: Skin is warm and dry.     Findings: Ecchymosis present.     Comments: Good distal cap refill. RLE is not dusky in appearance or cool to touch.  Scattered ecchymosis noted to the lateral aspect of the right knee.  Neurological:     Mental Status: She is alert.  Psychiatric:        Speech: Speech normal.        Behavior: Behavior normal.     ED Results / Procedures / Treatments   Labs (all labs ordered are listed, but only abnormal results are displayed) Labs Reviewed - No data to display  EKG None  Radiology DG Knee Complete 4 Views Left  Result Date: 05/05/2019 CLINICAL DATA:  Status post fall. EXAM: LEFT KNEE - COMPLETE 4+ VIEW COMPARISON:  None. FINDINGS: No evidence of fracture or dislocation. Large radiopaque surgical screws are seen within the distal left femur and proximal left tibia. No evidence of arthropathy or other focal bone abnormality. There is a small to moderate size joint effusion. IMPRESSION: 1. Postoperative  changes with a small to moderate sized joint effusion. Electronically Signed   By: Virgina Norfolk M.D.   On: 05/05/2019 17:03    Procedures Procedures (including critical care time)  Medications Ordered in ED Medications - No data to display  ED Course  I have reviewed the triage vital signs and the nursing notes.  Pertinent labs & imaging results that were available  during my care of the patient were reviewed by me and considered in my medical decision making (see chart for details).    MDM Rules/Calculators/A&P                      54 year old female who presents for evaluation of left knee pain after a deceased body fell on her knee while at work.  Has been able to ambulate and bear weight.  Previous history of surgery.  On initial ED arrival, she is afebrile, nontoxic-appearing.  Vital signs are stable.  She is neurovascularly intact.  Concern for knee sprain versus fracture.  Plan for x-rays.  X-rays reviewed.  Negative for any acute bony abnormality but she does have a small to moderate sized joint effusion.  Question if this is postoperative versus acute from trauma.  We will plan to treat her as knee sprain with knee immobilizer and crutches.  I discussed results with patient.  At this time, do not feel that it is within the best interest to drain the infusion given that it was traumatic.  We will plan to give her outpatient orthopedic for her to follow-up with.  Encouraged at home supportive care measures. At this time, patient exhibits no emergent life-threatening condition that require further evaluation in ED or admission. Patient had ample opportunity for questions and discussion. All patient's questions were answered with full understanding. Strict return precautions discussed. Patient expresses understanding and agreement to plan.   Portions of this note were generated with [employer]. Dictation errors may occur despite best attempts at proofreading.    Final Clinical Impression(s) / ED Diagnoses Final diagnoses:  Sprain of left knee, unspecified ligament, initial encounter  Effusion of left knee    Rx / DC Orders ED Discharge Orders    None       Desma Mcgregor 05/05/19 2347    Virgel Manifold, MD 05/06/19 1515

## 2019-05-05 NOTE — ED Notes (Signed)
Pt transported to xray 

## 2019-05-05 NOTE — Discharge Instructions (Signed)
As we discussed, your x-ray did not reveal any acute broken bone.  You do have a mild effusion which is some swelling in the knee joint.  This could be result of your minor trauma.  Additionally, x-ray will not shows any muscle or ligament injury.  You will need to follow-up with referred outpatient orthopedic doctor for further evaluation.  You can take Tylenol or Ibuprofen as directed for pain. You can alternate Tylenol and Ibuprofen every 4 hours. If you take Tylenol at 1pm, then you can take Ibuprofen at 5pm. Then you can take Tylenol again at 9pm.   Follow the RICE (Rest, Ice, Compression, Elevation) protocol as directed.   Wear knee immobilizer and use crutches until you are seen by orthopedics.  Return to the emergency department for any worsening pain, notes or swelling, fevers, numbness/weakness or any other worsening or concerning symptoms.

## 2019-05-05 NOTE — ED Triage Notes (Signed)
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 Pt has had surgery on this knee for ligament repair "years ago". Pt has bruising to left lateral portion of knee with swelling.

## 2019-05-05 NOTE — ED Notes (Signed)
Pt discharge and splint/crutch usage education provided. Pt verbalizes understanding. Pt is alert and oriented x 4 and ambulatory at discharge.

## 2019-05-05 NOTE — Progress Notes (Signed)
Orthopedic Tech Progress Note Patient Details:  Tina Arroyo 09-25-1965 [id number]  Ortho Devices Type of Ortho Device: Knee Immobilizer Ortho Device/Splint Location: left Ortho Device/Splint Interventions: Application   Post Interventions Patient Tolerated: Well Instructions Provided: Care of device   [name] 05/05/2019, 6:09 PM

## 2019-06-26 DIAGNOSIS — M25562 Pain in left knee: Secondary | ICD-10-CM | POA: Insufficient documentation

## 2020-09-05 ENCOUNTER — Encounter: Payer: Self-pay | Admitting: Internal Medicine

## 2020-09-05 ENCOUNTER — Other Ambulatory Visit: Payer: Self-pay

## 2020-09-05 ENCOUNTER — Ambulatory Visit (INDEPENDENT_AMBULATORY_CARE_PROVIDER_SITE_OTHER): Payer: 59 | Admitting: Internal Medicine

## 2020-09-05 VITALS — BP 152/88 | HR 89 | Temp 99.0°F | Resp 16 | Ht 63.25 in | Wt 127.2 lb

## 2020-09-05 DIAGNOSIS — N1831 Chronic kidney disease, stage 3a: Secondary | ICD-10-CM | POA: Diagnosis not present

## 2020-09-05 DIAGNOSIS — I10 Essential (primary) hypertension: Secondary | ICD-10-CM

## 2020-09-05 DIAGNOSIS — Z0001 Encounter for general adult medical examination with abnormal findings: Secondary | ICD-10-CM | POA: Insufficient documentation

## 2020-09-05 DIAGNOSIS — E559 Vitamin D deficiency, unspecified: Secondary | ICD-10-CM | POA: Diagnosis not present

## 2020-09-05 DIAGNOSIS — Z1159 Encounter for screening for other viral diseases: Secondary | ICD-10-CM

## 2020-09-05 DIAGNOSIS — Z1231 Encounter for screening mammogram for malignant neoplasm of breast: Secondary | ICD-10-CM | POA: Insufficient documentation

## 2020-09-05 LAB — CBC WITH DIFFERENTIAL/PLATELET
Basophils Absolute: 0 10*3/uL (ref 0.0–0.1)
Basophils Relative: 0.4 % (ref 0.0–3.0)
Eosinophils Absolute: 0.1 10*3/uL (ref 0.0–0.7)
Eosinophils Relative: 2 % (ref 0.0–5.0)
HCT: 39.4 % (ref 36.0–46.0)
Hemoglobin: 13.4 g/dL (ref 12.0–15.0)
Lymphocytes Relative: 36.6 % (ref 12.0–46.0)
Lymphs Abs: 2 10*3/uL (ref 0.7–4.0)
MCHC: 34.1 g/dL (ref 30.0–36.0)
MCV: 88.7 fl (ref 78.0–100.0)
Monocytes Absolute: 0.4 10*3/uL (ref 0.1–1.0)
Monocytes Relative: 8 % (ref 3.0–12.0)
Neutro Abs: 2.9 10*3/uL (ref 1.4–7.7)
Neutrophils Relative %: 53 % (ref 43.0–77.0)
Platelets: 189 10*3/uL (ref 150.0–400.0)
RBC: 4.44 Mil/uL (ref 3.87–5.11)
RDW: 15 % (ref 11.5–15.5)
WBC: 5.5 10*3/uL (ref 4.0–10.5)

## 2020-09-05 LAB — TSH: TSH: 0.65 u[IU]/mL (ref 0.35–4.50)

## 2020-09-05 LAB — VITAMIN D 25 HYDROXY (VIT D DEFICIENCY, FRACTURES): VITD: 18.51 ng/mL — ABNORMAL LOW (ref 30.00–100.00)

## 2020-09-05 NOTE — Progress Notes (Addendum)
Subjective:  Patient ID: Tina Arroyo, female    DOB: [birth year]  Age: 55 y.o. MRN: [id number]  CC: Annual Exam and Hypertension  This visit occurred during the SARS-CoV-2 public health emergency.  Safety protocols were in place, including screening questions prior to the visit, additional usage of staff PPE, and extensive cleaning of exam room while observing appropriate contact time as indicated for disinfecting solutions.    HPI Tina Arroyo presents for a CPX, f/up, and to establish.  She has a hx of HTN but is not currently taking an antihypertensive. She has had a hew HA's recently but denies CP, DOE, palpitations, edema, or fatigue.  Past Medical History:  Diagnosis Date  . Anemia   . Bleeding stomach ulcer 2016  . Candida esophagitis ([place])   . Hypertension   . Internal hemorrhoids   . Migraines    Past Surgical History:  Procedure Laterality Date  . ABDOMINAL HYSTERECTOMY    . ESOPHAGOGASTRODUODENOSCOPY N/A 02/25/2015   Procedure: ESOPHAGOGASTRODUODENOSCOPY (EGD);  Surgeon: Wilford Corner, MD;  Location: [facility];  Service: Endoscopy;  Laterality: N/A;  . FOOT SURGERY    . knee     left "ligament got tore up" surgery  . MULTIPLE TOOTH EXTRACTIONS    . TUBAL LIGATION      reports that she has never smoked. She has never used smokeless tobacco. She reports current alcohol use. She reports that she does not use drugs. family history includes Breast cancer in her maternal aunt and maternal grandmother; Cancer in her brother; Diabetes in her maternal grandfather and mother; Heart disease in her maternal grandmother; Hypertension in her mother. Allergies  Allergen Reactions  . Tuberculin Tests     Outpatient Medications Prior to Visit  Medication Sig Dispense Refill  . DUEXIS 800-26.6 MG TABS Take 1 tablet by mouth 2 (two) times daily.  (Patient not taking: Reported on 09/05/2020)  2  . lisinopril (PRINIVIL,ZESTRIL) 10 MG tablet Take 1 tablet (10 mg total) by  mouth daily. (Patient not taking: Reported on 09/05/2020) 30 tablet 3  . methocarbamol (ROBAXIN) 500 MG tablet Take 1 tablet (500 mg total) by mouth 2 (two) times daily. (Patient not taking: Reported on 09/05/2020) 20 tablet 0  . naproxen (NAPROSYN) 500 MG tablet Take 1 tablet (500 mg total) by mouth 2 (two) times daily. (Patient not taking: Reported on 09/05/2020) 30 tablet 0  . pantoprazole (PROTONIX) 40 MG tablet Take 1 tablet (40 mg total) by mouth 2 (two) times daily. (Patient not taking: Reported on 09/05/2020) 60 tablet 3  . 0.9 %  sodium chloride infusion      No facility-administered medications prior to visit.    ROS Review of Systems  Constitutional: Negative for diaphoresis and fatigue.  HENT: Negative.   Eyes: Negative for visual disturbance.  Respiratory: Negative for apnea, cough, chest tightness, shortness of breath and wheezing.   Cardiovascular: Negative for chest pain, palpitations and leg swelling.  Gastrointestinal: Negative for abdominal pain, constipation, diarrhea, nausea and vomiting.  Endocrine: Negative.   Genitourinary: Negative for decreased urine volume, difficulty urinating and hematuria.  Skin: Negative.   Neurological: Positive for headaches. Negative for dizziness, speech difficulty, weakness and light-headedness.  Hematological: Negative for adenopathy. Does not bruise/bleed easily.  Psychiatric/Behavioral: Negative.     Objective:  BP (!) 152/88 (BP Location: Right Arm, Patient Position: Sitting, Cuff Size: Large)   Pulse 89   Temp 99 F (37.2 C) (Oral)   Resp 16   Ht 5' 3.25" (1.607  m)   Wt 127 lb 3.2 oz (57.7 kg)   SpO2 99%   BMI 22.35 kg/m   BP Readings from Last 3 Encounters:  09/05/20 (!) 152/88  05/05/19 139/89  09/12/16 116/80    Wt Readings from Last 3 Encounters:  09/05/20 127 lb 3.2 oz (57.7 kg)  09/12/16 121 lb 9.6 oz (55.2 kg)  08/31/16 127 lb 3.2 oz (57.7 kg)    Physical Exam Vitals reviewed.  Constitutional:       Appearance: Normal appearance.  HENT:     Nose: Nose normal.     Mouth/Throat:     Mouth: Mucous membranes are moist.  Eyes:     General: No scleral icterus.    Conjunctiva/sclera: Conjunctivae normal.  Cardiovascular:     Rate and Rhythm: Normal rate and regular rhythm.     Heart sounds: No murmur heard.     Comments: EKG-  NSR, 71 bpm +LAE/+LVH No Q waves Pulmonary:     Effort: Pulmonary effort is normal.     Breath sounds: No stridor. No wheezing, rhonchi or rales.  Abdominal:     General: Abdomen is flat. Bowel sounds are normal. There is no distension.     Palpations: Abdomen is soft. There is no fluid wave, hepatomegaly, splenomegaly or mass.     Tenderness: There is no abdominal tenderness.  Musculoskeletal:     Cervical back: Neck supple.     Right lower leg: No edema.     Left lower leg: No edema.  Lymphadenopathy:     Cervical: No cervical adenopathy.  Skin:    General: Skin is warm and dry.  Neurological:     General: No focal deficit present.     Mental Status: She is alert.  Psychiatric:        Mood and Affect: Mood normal.        Behavior: Behavior normal.     Lab Results  Component Value Date   WBC 5.5 09/05/2020   HGB 13.4 09/05/2020   HCT 39.4 09/05/2020   PLT 189.0 09/05/2020   GLUCOSE 97 09/05/2020   CHOL 184 09/05/2020   TRIG 87.0 09/05/2020   HDL 52.20 09/05/2020   LDLCALC 115 (H) 09/05/2020   ALT 19 09/05/2020   AST 23 09/05/2020   NA 142 09/05/2020   K 3.9 09/05/2020   CL 102 09/05/2020   CREATININE 1.07 09/05/2020   BUN 14 09/05/2020   CO2 30 09/05/2020   TSH 0.65 09/05/2020   HGBA1C 5.3 12/30/2015    DG Knee Complete 4 Views Left  Result Date: 05/05/2019 CLINICAL DATA:  Status post fall. EXAM: LEFT KNEE - COMPLETE 4+ VIEW COMPARISON:  None. FINDINGS: No evidence of fracture or dislocation. Large radiopaque surgical screws are seen within the distal left femur and proximal left tibia. No evidence of arthropathy or other focal  bone abnormality. There is a small to moderate size joint effusion. IMPRESSION: 1. Postoperative changes with a small to moderate sized joint effusion. Electronically Signed   By: Virgina Norfolk M.D.   On: 05/05/2019 17:03    Assessment & Plan:   Tina Arroyo was seen today for annual exam and hypertension.  Diagnoses and all orders for this visit:  Primary hypertension- Her BP is not adequately controlled. Will start carvedilol. -     CBC with Differential/Platelet; Future -     Basic metabolic panel; Future -     TSH; Future -     Urinalysis, Routine w reflex microscopic; Future -  VITAMIN D 25 Hydroxy (Vit-D Deficiency, Fractures); Future -     Hepatic function panel; Future -     Hepatic function panel -     VITAMIN D 25 Hydroxy (Vit-D Deficiency, Fractures) -     Urinalysis, Routine w reflex microscopic -     TSH -     Basic metabolic panel -     CBC with Differential/Platelet -     EKG 12-Lead -     carvedilol (COREG) 3.125 MG tablet; Take 1 tablet (3.125 mg total) by mouth 2 (two) times daily with a meal.  Visit for screening mammogram -     MM DIGITAL SCREENING BILATERAL; Future  Need for hepatitis C screening test -     Hepatitis C antibody; Future -     Hepatitis C antibody  Encounter for general adult medical examination with abnormal findings- Exam completed, labs reviewed- statin tx is not indicated, vaccines are UTD, cancer screenings addressed, pt ed material was given. -     Lipid panel; Future -     Lipid panel  Stage 3a chronic kidney disease ([place])- Will get better BP control. She will avoid nephrotoxic agents.  Vitamin D deficiency disease -     Cholecalciferol 50 MCG (2000 UT) TABS; Take 2 tablets (4,000 Units total) by mouth daily.   I have discontinued Isys Jumper's Duexis, lisinopril, pantoprazole, naproxen, and methocarbamol. I am also having her start on carvedilol and Cholecalciferol. We will stop administering sodium chloride.  Meds ordered  this encounter  Medications  . carvedilol (COREG) 3.125 MG tablet    Sig: Take 1 tablet (3.125 mg total) by mouth 2 (two) times daily with a meal.    Dispense:  180 tablet    Refill:  0  . Cholecalciferol 50 MCG (2000 UT) TABS    Sig: Take 2 tablets (4,000 Units total) by mouth daily.    Dispense:  180 tablet    Refill:  0     Follow-up: Return in about 6 months (around 03/07/2021).  Scarlette Calico, MD

## 2020-09-05 NOTE — Patient Instructions (Signed)
Health Maintenance, Female Adopting a healthy lifestyle and getting preventive care are important in promoting health and wellness. Ask your health care provider about:  The right schedule for you to have regular tests and exams.  Things you can do on your own to prevent diseases and keep yourself healthy. What should I know about diet, weight, and exercise? Eat a healthy diet  Eat a diet that includes plenty of vegetables, fruits, low-fat dairy products, and lean protein.  Do not eat a lot of foods that are high in solid fats, added sugars, or sodium.   Maintain a healthy weight Body mass index (BMI) is used to identify weight problems. It estimates body fat based on height and weight. Your health care provider can help determine your BMI and help you achieve or maintain a healthy weight. Get regular exercise Get regular exercise. This is one of the most important things you can do for your health. Most adults should:  Exercise for at least 150 minutes each week. The exercise should increase your heart rate and make you sweat (moderate-intensity exercise).  Do strengthening exercises at least twice a week. This is in addition to the moderate-intensity exercise.  Spend less time sitting. Even light physical activity can be beneficial. Watch cholesterol and blood lipids Have your blood tested for lipids and cholesterol at 54 years of age, then have this test every 5 years. Have your cholesterol levels checked more often if:  Your lipid or cholesterol levels are high.  You are older than 54 years of age.  You are at high risk for heart disease. What should I know about cancer screening? Depending on your health history and family history, you may need to have cancer screening at various ages. This may include screening for:  Breast cancer.  Cervical cancer.  Colorectal cancer.  Skin cancer.  Lung cancer. What should I know about heart disease, diabetes, and high blood  pressure? Blood pressure and heart disease  High blood pressure causes heart disease and increases the risk of stroke. This is more likely to develop in people who have high blood pressure readings, are of African descent, or are overweight.  Have your blood pressure checked: ? Every 3-5 years if you are 18-39 years of age. ? Every year if you are 40 years old or older. Diabetes Have regular diabetes screenings. This checks your fasting blood sugar level. Have the screening done:  Once every three years after age 40 if you are at a normal weight and have a low risk for diabetes.  More often and at a younger age if you are overweight or have a high risk for diabetes. What should I know about preventing infection? Hepatitis B If you have a higher risk for hepatitis B, you should be screened for this virus. Talk with your health care provider to find out if you are at risk for hepatitis B infection. Hepatitis C Testing is recommended for:  Everyone born from 1945 through 1965.  Anyone with known risk factors for hepatitis C. Sexually transmitted infections (STIs)  Get screened for STIs, including gonorrhea and chlamydia, if: ? You are sexually active and are younger than 54 years of age. ? You are older than 54 years of age and your health care provider tells you that you are at risk for this type of infection. ? Your sexual activity has changed since you were last screened, and you are at increased risk for chlamydia or gonorrhea. Ask your health care provider   if you are at risk.  Ask your health care provider about whether you are at high risk for HIV. Your health care provider may recommend a prescription medicine to help prevent HIV infection. If you choose to take medicine to prevent HIV, you should first get tested for HIV. You should then be tested every 3 months for as long as you are taking the medicine. Pregnancy  If you are about to stop having your period (premenopausal) and  you may become pregnant, seek counseling before you get pregnant.  Take 400 to 800 micrograms (mcg) of folic acid every day if you become pregnant.  Ask for birth control (contraception) if you want to prevent pregnancy. Osteoporosis and menopause Osteoporosis is a disease in which the bones lose minerals and strength with aging. This can result in bone fractures. If you are 65 years old or older, or if you are at risk for osteoporosis and fractures, ask your health care provider if you should:  Be screened for bone loss.  Take a calcium or vitamin D supplement to lower your risk of fractures.  Be given hormone replacement therapy (HRT) to treat symptoms of menopause. Follow these instructions at home: Lifestyle  Do not use any products that contain nicotine or tobacco, such as cigarettes, e-cigarettes, and chewing tobacco. If you need help quitting, ask your health care provider.  Do not use street drugs.  Do not share needles.  Ask your health care provider for help if you need support or information about quitting drugs. Alcohol use  Do not drink alcohol if: ? Your health care provider tells you not to drink. ? You are pregnant, may be pregnant, or are planning to become pregnant.  If you drink alcohol: ? Limit how much you use to 0-1 drink a day. ? Limit intake if you are breastfeeding.  Be aware of how much alcohol is in your drink. In the [place] one drink equals one 12 oz bottle of beer (355 mL), one 5 oz glass of wine (148 mL), or one 1 oz glass of hard liquor (44 mL). General instructions  Schedule regular health, dental, and eye exams.  Stay current with your vaccines.  Tell your health care provider if: ? You often feel depressed. ? You have ever been abused or do not feel safe at home. Summary  Adopting a healthy lifestyle and getting preventive care are important in promoting health and wellness.  Follow your health care provider's instructions about healthy  diet, exercising, and getting tested or screened for diseases.  Follow your health care provider's instructions on monitoring your cholesterol and blood pressure. This information is not intended to replace advice given to you by your health care provider. Make sure you discuss any questions you have with your health care provider. Document Revised: 03/12/2018 Document Reviewed: 03/12/2018 Elsevier Patient Education  2021 [employer]  

## 2020-09-06 ENCOUNTER — Encounter: Payer: Self-pay | Admitting: Internal Medicine

## 2020-09-06 DIAGNOSIS — N1831 Chronic kidney disease, stage 3a: Secondary | ICD-10-CM | POA: Insufficient documentation

## 2020-09-06 LAB — URINALYSIS, ROUTINE W REFLEX MICROSCOPIC
Bilirubin Urine: NEGATIVE
Hgb urine dipstick: NEGATIVE
Leukocytes,Ua: NEGATIVE
Nitrite: NEGATIVE
RBC / HPF: NONE SEEN (ref 0–?)
Specific Gravity, Urine: >=1.03 — AB (ref 1.000–1.030)
Total Protein, Urine: NEGATIVE
Urine Glucose: NEGATIVE
Urobilinogen, UA: 1 (ref 0.0–1.0)
pH: 5.5 (ref 5.0–8.0)

## 2020-09-06 LAB — BASIC METABOLIC PANEL
BUN: 14 mg/dL (ref 6–23)
CO2: 30 mEq/L (ref 19–32)
Calcium: 10 mg/dL (ref 8.4–10.5)
Chloride: 102 mEq/L (ref 96–112)
Creatinine, Ser: 1.07 mg/dL (ref 0.40–1.20)
GFR: 58.95 mL/min — ABNORMAL LOW (ref >60.00)
Glucose, Bld: 97 mg/dL (ref 70–99)
Potassium: 3.9 mEq/L (ref 3.5–5.1)
Sodium: 142 mEq/L (ref 135–145)

## 2020-09-06 LAB — HEPATIC FUNCTION PANEL
ALT: 19 U/L (ref 0–35)
AST: 23 U/L (ref 0–37)
Albumin: 4.4 g/dL (ref 3.5–5.2)
Alkaline Phosphatase: 76 U/L (ref 39–117)
Bilirubin, Direct: 0 mg/dL (ref 0.0–0.3)
Total Bilirubin: 0.3 mg/dL (ref 0.2–1.2)
Total Protein: 7.9 g/dL (ref 6.0–8.3)

## 2020-09-06 LAB — LIPID PANEL
Cholesterol: 184 mg/dL (ref 0–200)
HDL: 52.2 mg/dL (ref >39.00)
LDL Cholesterol: 115 mg/dL — ABNORMAL HIGH (ref 0–99)
NonHDL: 131.96
Total CHOL/HDL Ratio: 4
Triglycerides: 87 mg/dL (ref 0.0–149.0)
VLDL: 17.4 mg/dL (ref 0.0–40.0)

## 2020-09-06 LAB — HEPATITIS C ANTIBODY
Hepatitis C Ab: NONREACTIVE
SIGNAL TO CUT-OFF: 0.01 (ref <1.00)

## 2020-09-06 MED ORDER — CHOLECALCIFEROL 50 MCG (2000 UT) PO TABS: 2.0000 | ORAL_TABLET | Freq: Every day | ORAL | 0 refills | Status: DC

## 2020-09-06 MED ORDER — CARVEDILOL 3.125 MG PO TABS: 3.1250 mg | ORAL_TABLET | Freq: Two times a day (BID) | ORAL | 0 refills | Status: DC

## 2020-09-07 ENCOUNTER — Encounter: Payer: Self-pay | Admitting: Internal Medicine

## 2020-11-29 ENCOUNTER — Other Ambulatory Visit: Payer: Self-pay

## 2020-11-29 ENCOUNTER — Other Ambulatory Visit: Payer: Self-pay | Admitting: Internal Medicine

## 2020-11-29 ENCOUNTER — Ambulatory Visit
Admission: RE | Admit: 2020-11-29 | Discharge: 2020-11-29 | Disposition: A | Discharge status: Home / Self Care | Payer: 59 | Source: Ambulatory Visit | Attending: Internal Medicine | Admitting: Internal Medicine

## 2020-11-29 DIAGNOSIS — Z1231 Encounter for screening mammogram for malignant neoplasm of breast: Secondary | ICD-10-CM

## 2020-12-06 ENCOUNTER — Other Ambulatory Visit: Payer: Self-pay | Admitting: Family Medicine

## 2020-12-06 ENCOUNTER — Other Ambulatory Visit: Payer: Self-pay | Admitting: Internal Medicine

## 2020-12-06 DIAGNOSIS — R928 Other abnormal and inconclusive findings on diagnostic imaging of breast: Secondary | ICD-10-CM

## 2020-12-21 ENCOUNTER — Other Ambulatory Visit: Payer: Self-pay

## 2020-12-22 ENCOUNTER — Ambulatory Visit (INDEPENDENT_AMBULATORY_CARE_PROVIDER_SITE_OTHER): Payer: 59 | Admitting: Internal Medicine

## 2020-12-22 ENCOUNTER — Other Ambulatory Visit: Payer: Self-pay | Admitting: Internal Medicine

## 2020-12-22 ENCOUNTER — Encounter: Payer: Self-pay | Admitting: Internal Medicine

## 2020-12-22 VITALS — BP 132/80 | HR 62 | Temp 98.2°F | Ht 63.25 in | Wt 122.0 lb

## 2020-12-22 DIAGNOSIS — K921 Melena: Secondary | ICD-10-CM | POA: Diagnosis not present

## 2020-12-22 DIAGNOSIS — I10 Essential (primary) hypertension: Secondary | ICD-10-CM | POA: Diagnosis not present

## 2020-12-22 DIAGNOSIS — R1084 Generalized abdominal pain: Secondary | ICD-10-CM

## 2020-12-22 DIAGNOSIS — Z23 Encounter for immunization: Secondary | ICD-10-CM

## 2020-12-22 DIAGNOSIS — K2101 Gastro-esophageal reflux disease with esophagitis, with bleeding: Secondary | ICD-10-CM

## 2020-12-22 LAB — BASIC METABOLIC PANEL
BUN: 19 mg/dL (ref 6–23)
CO2: 32 mEq/L (ref 19–32)
Calcium: 9.2 mg/dL (ref 8.4–10.5)
Chloride: 106 mEq/L (ref 96–112)
Creatinine, Ser: 0.95 mg/dL (ref 0.40–1.20)
GFR: 67.86 mL/min (ref >60.00)
Glucose, Bld: 89 mg/dL (ref 70–99)
Potassium: 4.2 mEq/L (ref 3.5–5.1)
Sodium: 142 mEq/L (ref 135–145)

## 2020-12-22 LAB — CBC WITH DIFFERENTIAL/PLATELET
Basophils Absolute: 0 10*3/uL (ref 0.0–0.1)
Basophils Relative: 0.8 % (ref 0.0–3.0)
Eosinophils Absolute: 0.2 10*3/uL (ref 0.0–0.7)
Eosinophils Relative: 3.7 % (ref 0.0–5.0)
HCT: 36.5 % (ref 36.0–46.0)
Hemoglobin: 12.2 g/dL (ref 12.0–15.0)
Lymphocytes Relative: 44.6 % (ref 12.0–46.0)
Lymphs Abs: 2.5 10*3/uL (ref 0.7–4.0)
MCHC: 33.4 g/dL (ref 30.0–36.0)
MCV: 90.3 fl (ref 78.0–100.0)
Monocytes Absolute: 0.6 10*3/uL (ref 0.1–1.0)
Monocytes Relative: 10.6 % (ref 3.0–12.0)
Neutro Abs: 2.3 10*3/uL (ref 1.4–7.7)
Neutrophils Relative %: 40.3 % — ABNORMAL LOW (ref 43.0–77.0)
Platelets: 190 10*3/uL (ref 150.0–400.0)
RBC: 4.05 Mil/uL (ref 3.87–5.11)
RDW: 15 % (ref 11.5–15.5)
WBC: 5.7 10*3/uL (ref 4.0–10.5)

## 2020-12-22 LAB — LIPASE: Lipase: 20 U/L (ref 11.0–59.0)

## 2020-12-22 LAB — SEDIMENTATION RATE: Sed Rate: 32 mm/hr — ABNORMAL HIGH (ref 0–30)

## 2020-12-22 LAB — AMYLASE: Amylase: 48 U/L (ref 27–131)

## 2020-12-22 MED ORDER — ESOMEPRAZOLE MAGNESIUM 40 MG PO CPDR: 40.0000 mg | DELAYED_RELEASE_CAPSULE | Freq: Every day | ORAL | 1 refills | Status: DC

## 2020-12-22 NOTE — Progress Notes (Signed)
Subjective:  Patient ID: Tina Arroyo, female    DOB: 12-06-1965  Age: 55 y.o. MRN: [id number]  CC: Hypertension and Abdominal Pain  This visit occurred during the SARS-CoV-2 public health emergency.  Safety protocols were in place, including screening questions prior to the visit, additional usage of staff PPE, and extensive cleaning of exam room while observing appropriate contact time as indicated for disinfecting solutions.    HPI Tina Arroyo presents for f/up -  She complains of a 10-day history of epigastric abdominal pain that she describes as a throbbing sensation.  She has tried to control the pain with ibuprofen.  She has had mild nausea but denies vomiting or loss of appetite.  She has had some weight loss.  She also has heartburn but denies odynophagia or dysphagia.  She is seen a few episodes of bright red blood per rectum.  Outpatient Medications Prior to Visit  Medication Sig Dispense Refill   carvedilol (COREG) 3.125 MG tablet Take 1 tablet (3.125 mg total) by mouth 2 (two) times daily with a meal. 180 tablet 0   Cholecalciferol 50 MCG (2000 UT) TABS Take 2 tablets (4,000 Units total) by mouth daily. 180 tablet 0   No facility-administered medications prior to visit.    ROS Review of Systems  Constitutional:  Positive for unexpected weight change. Negative for diaphoresis and fatigue.  HENT: Negative.  Negative for trouble swallowing.   Eyes: Negative.   Respiratory: Negative.  Negative for cough, chest tightness, shortness of breath and wheezing.   Cardiovascular:  Negative for chest pain, palpitations and leg swelling.  Gastrointestinal:  Positive for abdominal pain, blood in stool and nausea. Negative for anal bleeding, constipation, diarrhea and vomiting.  Endocrine: Negative.   Genitourinary: Negative.  Negative for difficulty urinating and frequency.  Musculoskeletal: Negative.   Skin: Negative.   Neurological: Negative.  Negative for dizziness,  weakness, light-headedness and headaches.  Hematological:  Negative for adenopathy. Does not bruise/bleed easily.  Psychiatric/Behavioral: Negative.     Objective:  BP 132/80 (BP Location: Left Arm, Patient Position: Sitting, Cuff Size: Large)   Pulse 62   Temp 98.2 F (36.8 C) (Oral)   Ht 5' 3.25" (1.607 m)   Wt 122 lb (55.3 kg)   SpO2 97%   BMI 21.44 kg/m   BP Readings from Last 3 Encounters:  12/22/20 132/80  09/05/20 (!) 152/88  05/05/19 139/89    Wt Readings from Last 3 Encounters:  12/22/20 122 lb (55.3 kg)  09/05/20 127 lb 3.2 oz (57.7 kg)  09/12/16 121 lb 9.6 oz (55.2 kg)    Physical Exam Vitals reviewed. Exam conducted with a chaperone present (Shirron).  HENT:     Nose: Nose normal.     Mouth/Throat:     Mouth: Mucous membranes are moist.  Eyes:     General: No scleral icterus.    Conjunctiva/sclera: Conjunctivae normal.  Cardiovascular:     Rate and Rhythm: Normal rate and regular rhythm.     Heart sounds: No murmur heard. Pulmonary:     Effort: Pulmonary effort is normal.     Breath sounds: No stridor. No wheezing, rhonchi or rales.  Abdominal:     General: Abdomen is flat. Bowel sounds are normal.     Palpations: There is no hepatomegaly, splenomegaly or mass.     Tenderness: There is abdominal tenderness in the epigastric area. There is no guarding or rebound. Negative signs include Murphy's sign.     Hernia: There is  no hernia in the left inguinal area or right inguinal area.  Genitourinary:    Rectum: Guaiac result positive. External hemorrhoid and internal hemorrhoid present. No mass, tenderness or anal fissure. Normal anal tone.  Musculoskeletal:        General: Normal range of motion.     Cervical back: Neck supple.     Right lower leg: No edema.     Left lower leg: No edema.  Lymphadenopathy:     Cervical: No cervical adenopathy.     Lower Body: No right inguinal adenopathy. No left inguinal adenopathy.  Skin:    General: Skin is warm and  dry.     Coloration: Skin is not pale.  Neurological:     General: No focal deficit present.     Mental Status: She is alert.    Lab Results  Component Value Date   WBC 5.7 12/22/2020   HGB 12.2 12/22/2020   HCT 36.5 12/22/2020   PLT 190.0 12/22/2020   GLUCOSE 89 12/22/2020   CHOL 184 09/05/2020   TRIG 87.0 09/05/2020   HDL 52.20 09/05/2020   LDLCALC 115 (H) 09/05/2020   ALT 19 09/05/2020   AST 23 09/05/2020   NA 142 12/22/2020   K 4.2 12/22/2020   CL 106 12/22/2020   CREATININE 0.95 12/22/2020   BUN 19 12/22/2020   CO2 32 12/22/2020   TSH 0.65 09/05/2020   HGBA1C 5.3 12/30/2015    MM 3D SCREEN BREAST BILATERAL  Result Date: 12/04/2020 CLINICAL DATA:  Screening. EXAM: DIGITAL SCREENING BILATERAL MAMMOGRAM WITH TOMOSYNTHESIS AND CAD TECHNIQUE: Bilateral screening digital craniocaudal and mediolateral oblique mammograms were obtained. Bilateral screening digital breast tomosynthesis was performed. The images were evaluated with computer-aided detection. COMPARISON:  Previous exam(s). ACR Breast Density Category c: The breast tissue is heterogeneously dense, which may obscure small masses. FINDINGS: In the right breast calcifications in the outer central breast at posterior depth require further evaluation. In the left breast possible masses in the outer lower breast at posterior depth require further evaluation (CC frame 23/31, MLO frame 23 and 28). IMPRESSION: Further evaluation is suggested for calcifications in the right breast. Further evaluation is suggested for possible masses in the left breast. RECOMMENDATION: Diagnostic mammogram and possibly ultrasound of both breasts. (Code:[id number]-[id number]) The patient will be contacted regarding the findings, and additional imaging will be scheduled. BI-RADS CATEGORY  0: Incomplete. Need additional imaging evaluation and/or prior mammograms for comparison. Electronically Signed   By: Ileana Roup M.D.   On: 12/04/2020 14:58    Assessment & Plan:    Tina Arroyo was seen today for hypertension and abdominal pain.  Diagnoses and all orders for this visit:  Generalized abdominal pain- Her H&H is mildly lower than it was 3 months ago.  Her other labs are reassuring.  I am concerned she may have an upper GI bleed so I have asked her to stop taking ibuprofen and to start taking a PPI.  She will let me know if she develops any new or worsening symptoms. -     Cancel: CBC with Differential/Platelet; Future -     Cancel: Basic metabolic panel; Future -     Amylase; Future -     Lipase; Future -     Sedimentation rate; Future -     Basic metabolic panel; Future -     CBC with Differential/Platelet; Future -     CBC with Differential/Platelet -     Basic metabolic panel -     Sedimentation  rate -     Lipase -     Amylase  Bloody stool -     Cancel: CBC with Differential/Platelet; Future -     Cancel: Basic metabolic panel; Future -     Amylase; Future -     Lipase; Future -     Sedimentation rate; Future -     Basic metabolic panel; Future -     CBC with Differential/Platelet; Future -     CBC with Differential/Platelet -     Basic metabolic panel -     Sedimentation rate -     Lipase -     Amylase -     Ambulatory referral to Gastroenterology  Gastroesophageal reflux disease with esophagitis and hemorrhage -     Ambulatory referral to Gastroenterology -     esomeprazole (NEXIUM) 40 MG capsule; Take 1 capsule (40 mg total) by mouth daily.  Primary hypertension- Her blood pressure is adequately well controlled. -     carvedilol (COREG) 3.125 MG tablet; Take 1 tablet (3.125 mg total) by mouth 2 (two) times daily with a meal.  Other orders -     Flu Vaccine QUAD 6+ mos PF IM (Fluarix Quad PF)  I am having Tina Arroyo start on esomeprazole. I am also having her maintain her carvedilol.  Meds ordered this encounter  Medications   esomeprazole (NEXIUM) 40 MG capsule    Sig: Take 1 capsule (40 mg total) by mouth daily.     Dispense:  90 capsule    Refill:  1   carvedilol (COREG) 3.125 MG tablet    Sig: Take 1 tablet (3.125 mg total) by mouth 2 (two) times daily with a meal.    Dispense:  180 tablet    Refill:  0      Follow-up: Return in about 1 week (around 12/29/2020).  Scarlette Calico, MD

## 2020-12-22 NOTE — Patient Instructions (Signed)

## 2020-12-23 ENCOUNTER — Telehealth: Payer: Self-pay | Admitting: Internal Medicine

## 2020-12-23 DIAGNOSIS — E559 Vitamin D deficiency, unspecified: Secondary | ICD-10-CM

## 2020-12-23 MED ORDER — CHOLECALCIFEROL 50 MCG (2000 UT) PO TABS: 2.0000 | ORAL_TABLET | Freq: Every day | ORAL | 0 refills | Status: DC

## 2020-12-23 MED ORDER — CARVEDILOL 3.125 MG PO TABS: 3.1250 mg | ORAL_TABLET | Freq: Two times a day (BID) | ORAL | 0 refills | Status: DC

## 2020-12-23 NOTE — Telephone Encounter (Signed)
1.Medication Requested: Cholecalciferol 50 MCG (2000 UT) TABS  2. Pharmacy (Name, Street, [place]):  [place], [place] Phone:  [phone]  Fax:  [phone]      3. On Med List: yes  4. Last Visit with PCP: 09.22.22  5. Next visit date with PCP: 10.04.22   Agent: Please be advised that RX refills may take up to 3 business days. We ask that you follow-up with your pharmacy.

## 2020-12-26 ENCOUNTER — Encounter: Payer: Self-pay | Admitting: Internal Medicine

## 2020-12-30 ENCOUNTER — Other Ambulatory Visit: Payer: Self-pay | Admitting: Internal Medicine

## 2020-12-30 ENCOUNTER — Ambulatory Visit
Admission: RE | Admit: 2020-12-30 | Discharge: 2020-12-30 | Disposition: A | Discharge status: Home / Self Care | Payer: 59 | Source: Ambulatory Visit | Attending: Internal Medicine | Admitting: Internal Medicine

## 2020-12-30 ENCOUNTER — Other Ambulatory Visit: Payer: Self-pay

## 2020-12-30 DIAGNOSIS — R928 Other abnormal and inconclusive findings on diagnostic imaging of breast: Secondary | ICD-10-CM

## 2021-01-03 ENCOUNTER — Encounter: Payer: Self-pay | Admitting: Internal Medicine

## 2021-01-03 ENCOUNTER — Ambulatory Visit (INDEPENDENT_AMBULATORY_CARE_PROVIDER_SITE_OTHER): Payer: 59 | Admitting: Internal Medicine

## 2021-01-03 ENCOUNTER — Other Ambulatory Visit: Payer: Self-pay

## 2021-01-03 VITALS — BP 152/96 | HR 77 | Temp 98.9°F | Resp 16 | Ht 63.25 in | Wt 123.0 lb

## 2021-01-03 DIAGNOSIS — K921 Melena: Secondary | ICD-10-CM | POA: Diagnosis not present

## 2021-01-03 DIAGNOSIS — I10 Essential (primary) hypertension: Secondary | ICD-10-CM | POA: Diagnosis not present

## 2021-01-03 LAB — CBC WITH DIFFERENTIAL/PLATELET
Basophils Absolute: 0 10*3/uL (ref 0.0–0.1)
Basophils Relative: 0.2 % (ref 0.0–3.0)
Eosinophils Absolute: 0.2 10*3/uL (ref 0.0–0.7)
Eosinophils Relative: 3.5 % (ref 0.0–5.0)
HCT: 36.7 % (ref 36.0–46.0)
Hemoglobin: 12.3 g/dL (ref 12.0–15.0)
Lymphocytes Relative: 38.4 % (ref 12.0–46.0)
Lymphs Abs: 1.7 10*3/uL (ref 0.7–4.0)
MCHC: 33.4 g/dL (ref 30.0–36.0)
MCV: 90.4 fl (ref 78.0–100.0)
Monocytes Absolute: 0.4 10*3/uL (ref 0.1–1.0)
Monocytes Relative: 9 % (ref 3.0–12.0)
Neutro Abs: 2.2 10*3/uL (ref 1.4–7.7)
Neutrophils Relative %: 48.9 % (ref 43.0–77.0)
Platelets: 198 10*3/uL (ref 150.0–400.0)
RBC: 4.06 Mil/uL (ref 3.87–5.11)
RDW: 15 % (ref 11.5–15.5)
WBC: 4.5 10*3/uL (ref 4.0–10.5)

## 2021-01-03 MED ORDER — CARVEDILOL 6.25 MG PO TABS: 6.2500 mg | ORAL_TABLET | Freq: Two times a day (BID) | ORAL | 0 refills | Status: DC

## 2021-01-03 NOTE — Progress Notes (Signed)
Subjective:  Patient ID: Tina Arroyo, female    DOB: June 25, 1965  Age: 55 y.o. MRN: [id number]  CC: Hypertension  This visit occurred during the SARS-CoV-2 public health emergency.  Safety protocols were in place, including screening questions prior to the visit, additional usage of staff PPE, and extensive cleaning of exam room while observing appropriate contact time as indicated for disinfecting solutions.    HPI Tina Arroyo presents for f/up -  She has not taken her antihypertensive yet today.  She continues to complain of mild discomfort in the epigastric region and intermittent episodes of blood in her stool.  She denies nausea, vomiting, loss of appetite, or weight loss.  She has been told by GI that she cannot be seen for at least a month.  Outpatient Medications Prior to Visit  Medication Sig Dispense Refill   Cholecalciferol 50 MCG (2000 UT) TABS Take 2 tablets (4,000 Units total) by mouth daily. 180 tablet 0   esomeprazole (NEXIUM) 40 MG capsule Take 1 capsule (40 mg total) by mouth daily. 90 capsule 1   carvedilol (COREG) 3.125 MG tablet Take 1 tablet (3.125 mg total) by mouth 2 (two) times daily with a meal. 180 tablet 0   No facility-administered medications prior to visit.    ROS Review of Systems  Constitutional:  Negative for diaphoresis and fatigue.  HENT: Negative.    Eyes: Negative.   Gastrointestinal:  Positive for abdominal pain, anal bleeding and blood in stool. Negative for constipation, diarrhea, nausea and vomiting.  Endocrine: Negative.   Genitourinary: Negative.  Negative for difficulty urinating and dysuria.  Musculoskeletal: Negative.  Negative for myalgias.  Skin: Negative.  Negative for color change and pallor.  Neurological: Negative.  Negative for dizziness, weakness and light-headedness.  Hematological:  Negative for adenopathy. Does not bruise/bleed easily.  Psychiatric/Behavioral: Negative.     Objective:  BP (!) 152/96 (BP Location:  Right Arm, Patient Position: Sitting, Cuff Size: Large)   Pulse 77   Temp 98.9 F (37.2 C) (Oral)   Resp 16   Ht 5' 3.25" (1.607 m)   Wt 123 lb (55.8 kg)   SpO2 97%   BMI 21.62 kg/m   BP Readings from Last 3 Encounters:  01/03/21 (!) 152/96  12/22/20 132/80  09/05/20 (!) 152/88    Wt Readings from Last 3 Encounters:  01/03/21 123 lb (55.8 kg)  12/22/20 122 lb (55.3 kg)  09/05/20 127 lb 3.2 oz (57.7 kg)    Physical Exam Vitals reviewed.  HENT:     Nose: Nose normal.     Mouth/Throat:     Mouth: Mucous membranes are moist.  Eyes:     Conjunctiva/sclera: Conjunctivae normal.  Cardiovascular:     Rate and Rhythm: Normal rate and regular rhythm.     Heart sounds: No murmur heard. Pulmonary:     Effort: Pulmonary effort is normal.     Breath sounds: No stridor. No wheezing, rhonchi or rales.  Abdominal:     General: Abdomen is flat. Bowel sounds are normal. There is no distension.     Palpations: Abdomen is soft. There is no hepatomegaly, splenomegaly or mass.     Tenderness: There is no abdominal tenderness. There is no guarding.  Musculoskeletal:        General: Normal range of motion.     Cervical back: Neck supple.     Right lower leg: No edema.     Left lower leg: No edema.  Lymphadenopathy:  Cervical: No cervical adenopathy.  Skin:    General: Skin is warm and dry.     Coloration: Skin is not pale.  Neurological:     General: No focal deficit present.     Mental Status: She is alert.  Psychiatric:        Mood and Affect: Mood normal.        Behavior: Behavior normal.    Lab Results  Component Value Date   WBC 4.5 01/03/2021   HGB 12.3 01/03/2021   HCT 36.7 01/03/2021   PLT 198.0 01/03/2021   GLUCOSE 89 12/22/2020   CHOL 184 09/05/2020   TRIG 87.0 09/05/2020   HDL 52.20 09/05/2020   LDLCALC 115 (H) 09/05/2020   ALT 19 09/05/2020   AST 23 09/05/2020   NA 142 12/22/2020   K 4.2 12/22/2020   CL 106 12/22/2020   CREATININE 0.95 12/22/2020    BUN 19 12/22/2020   CO2 32 12/22/2020   TSH 0.65 09/05/2020   HGBA1C 5.3 12/30/2015    US BREAST LTD UNI LEFT INC AXILLA  Result Date: 12/30/2020 CLINICAL DATA:  Screening recall for right breast calcifications and a possible left breast mass. EXAM: DIGITAL DIAGNOSTIC BILATERAL MAMMOGRAM WITH TOMOSYNTHESIS AND CAD; ULTRASOUND LEFT BREAST LIMITED TECHNIQUE: Bilateral digital diagnostic mammography and breast tomosynthesis was performed. The images were evaluated with computer-aided detection.; Targeted ultrasound examination of the left breast was performed. COMPARISON:  Previous exam(s). ACR Breast Density Category c: The breast tissue is heterogeneously dense, which may obscure small masses. FINDINGS: In the right, the small group of calcifications are better characterized on magnification images. There are 4 5 small round to punctate calcifications in the far posterior, lateral aspect of the breast, spanning 2-3 mm, with no associated mass or distortion. On the left, the possible mass/masses persists, visualized as 2 partly circumscribed and partly obscured masses in the central to posterior aspect of the breast, lateral to mid 9, near 3-4 o'clock, measuring between 5 and 7 mm in long axis. There are no areas of architectural distortion. Targeted left breast ultrasound is performed, showing cysts. At 3 o'clock, 1 cm from the nipple, there is an oval cyst measuring 7 x 3 x 7 mm. Adjacent to this there is another cyst measuring 7 x 4 x 7 mm. There are no solid masses or suspicious lesions. IMPRESSION: 1. Probably benign 2-3 mm group of calcifications in the posterolateral right breast. Short-term follow-up recommended. 2. Benign left breast cysts. RECOMMENDATION: Diagnostic right breast mammography with magnification views in 6 months. I have discussed the findings and recommendations with the patient. If applicable, a reminder letter will be sent to the patient regarding the next appointment. BI-RADS  CATEGORY  3: Probably benign. Electronically Signed   By: Lajean Manes M.D.   On: 12/30/2020 16:06  MM DIAG BREAST TOMO BILATERAL  Result Date: 12/30/2020 CLINICAL DATA:  Screening recall for right breast calcifications and a possible left breast mass. EXAM: DIGITAL DIAGNOSTIC BILATERAL MAMMOGRAM WITH TOMOSYNTHESIS AND CAD; ULTRASOUND LEFT BREAST LIMITED TECHNIQUE: Bilateral digital diagnostic mammography and breast tomosynthesis was performed. The images were evaluated with computer-aided detection.; Targeted ultrasound examination of the left breast was performed. COMPARISON:  Previous exam(s). ACR Breast Density Category c: The breast tissue is heterogeneously dense, which may obscure small masses. FINDINGS: In the right, the small group of calcifications are better characterized on magnification images. There are 4 5 small round to punctate calcifications in the far posterior, lateral aspect of the breast, spanning 2-3  mm, with no associated mass or distortion. On the left, the possible mass/masses persists, visualized as 2 partly circumscribed and partly obscured masses in the central to posterior aspect of the breast, lateral to mid 9, near 3-4 o'clock, measuring between 5 and 7 mm in long axis. There are no areas of architectural distortion. Targeted left breast ultrasound is performed, showing cysts. At 3 o'clock, 1 cm from the nipple, there is an oval cyst measuring 7 x 3 x 7 mm. Adjacent to this there is another cyst measuring 7 x 4 x 7 mm. There are no solid masses or suspicious lesions. IMPRESSION: 1. Probably benign 2-3 mm group of calcifications in the posterolateral right breast. Short-term follow-up recommended. 2. Benign left breast cysts. RECOMMENDATION: Diagnostic right breast mammography with magnification views in 6 months. I have discussed the findings and recommendations with the patient. If applicable, a reminder letter will be sent to the patient regarding the next appointment. BI-RADS  CATEGORY  3: Probably benign. Electronically Signed   By: Lajean Manes M.D.   On: 12/30/2020 16:06   Assessment & Plan:   Lashawne was seen today for hypertension.  Diagnoses and all orders for this visit:  Uncontrolled stage 2 hypertension- Her blood pressure is not adequately well controlled.  I have asked her to be more compliant with carvedilol and will increase the dose of carvedilol. -     carvedilol (COREG) 6.25 MG tablet; Take 1 tablet (6.25 mg total) by mouth 2 (two) times daily with a meal.  Bloody stool -     CBC with Differential/Platelet; Future -     CBC with Differential/Platelet -     Ambulatory referral to Gastroenterology  Melena- Her H&H have improved slightly which is reassuring.  I referred her to a different gastroenterologist to see if she can be seen sooner since she has a history of peptic ulcer disease.  Will continue the PPI. -     CBC with Differential/Platelet; Future -     CBC with Differential/Platelet -     Ambulatory referral to Gastroenterology  I have discontinued Arlett F. Croll's carvedilol. I am also having her start on carvedilol. Additionally, I am having her maintain her esomeprazole and Cholecalciferol.  Meds ordered this encounter  Medications   carvedilol (COREG) 6.25 MG tablet    Sig: Take 1 tablet (6.25 mg total) by mouth 2 (two) times daily with a meal.    Dispense:  180 tablet    Refill:  0      Follow-up: Return in about 6 weeks (around 02/14/2021).  Scarlette Calico, MD

## 2021-01-03 NOTE — Patient Instructions (Signed)

## 2021-02-15 LAB — HM COLONOSCOPY

## 2021-02-17 ENCOUNTER — Other Ambulatory Visit: Payer: Self-pay | Admitting: Gastroenterology

## 2021-02-17 DIAGNOSIS — R109 Unspecified abdominal pain: Secondary | ICD-10-CM

## 2021-02-28 ENCOUNTER — Ambulatory Visit: Payer: 59 | Admitting: Internal Medicine

## 2021-03-01 ENCOUNTER — Ambulatory Visit
Admission: RE | Admit: 2021-03-01 | Discharge: 2021-03-01 | Disposition: A | Discharge status: Home / Self Care | Payer: 59 | Source: Ambulatory Visit | Attending: Gastroenterology | Admitting: Gastroenterology

## 2021-03-01 DIAGNOSIS — R109 Unspecified abdominal pain: Secondary | ICD-10-CM

## 2021-03-03 IMAGING — DX DG KNEE COMPLETE 4+V*L*
4 series · 4 of 4 positions shown · non-contrast
Comparison: None.

CLINICAL DATA: Status post fall.

EXAM:
LEFT KNEE - COMPLETE 4+ VIEW

[knee ap]
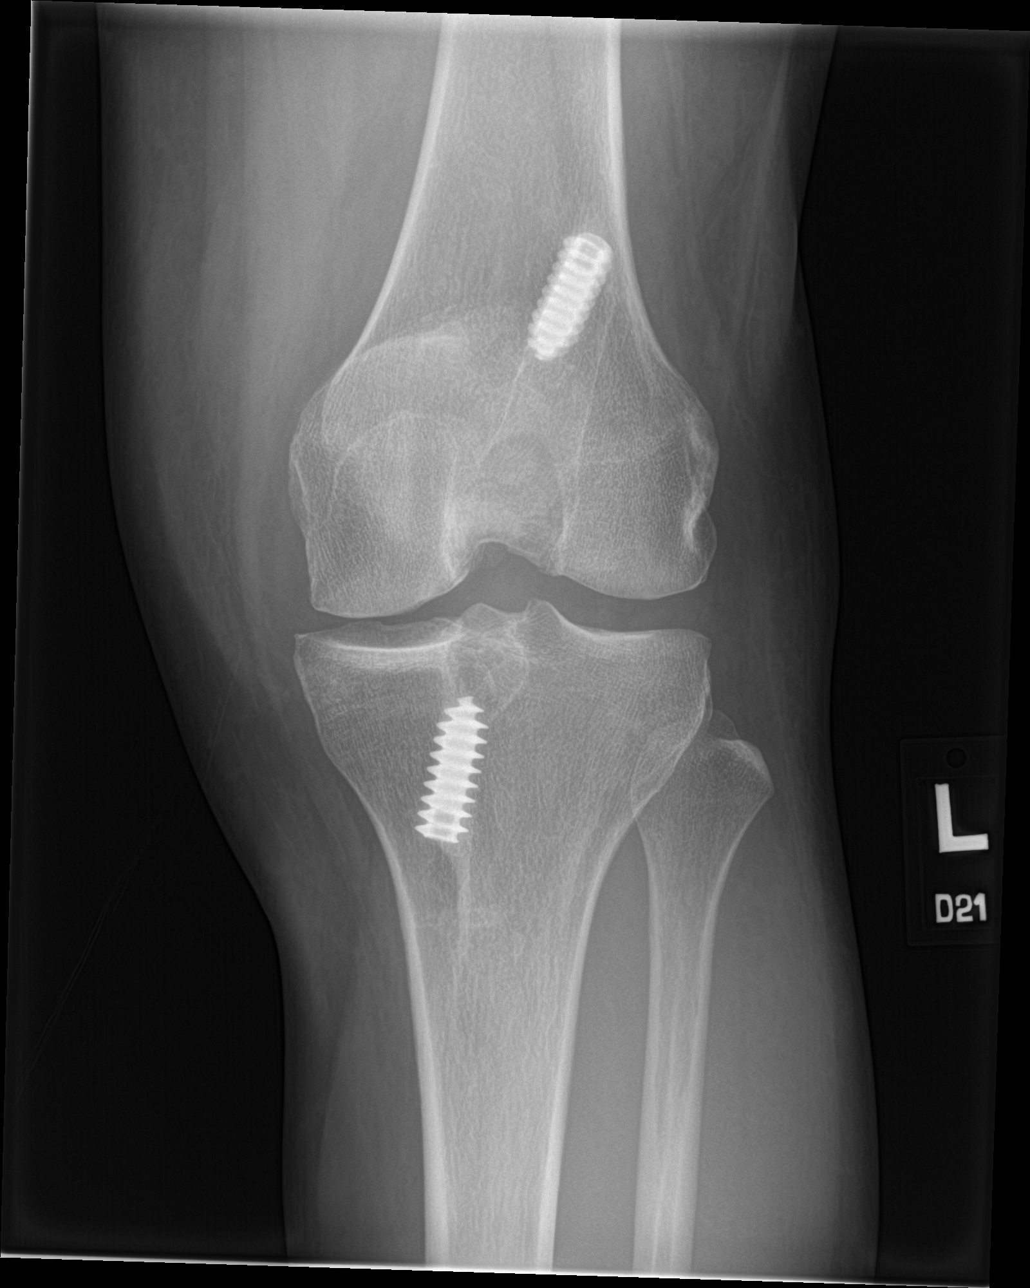

[knee lat]
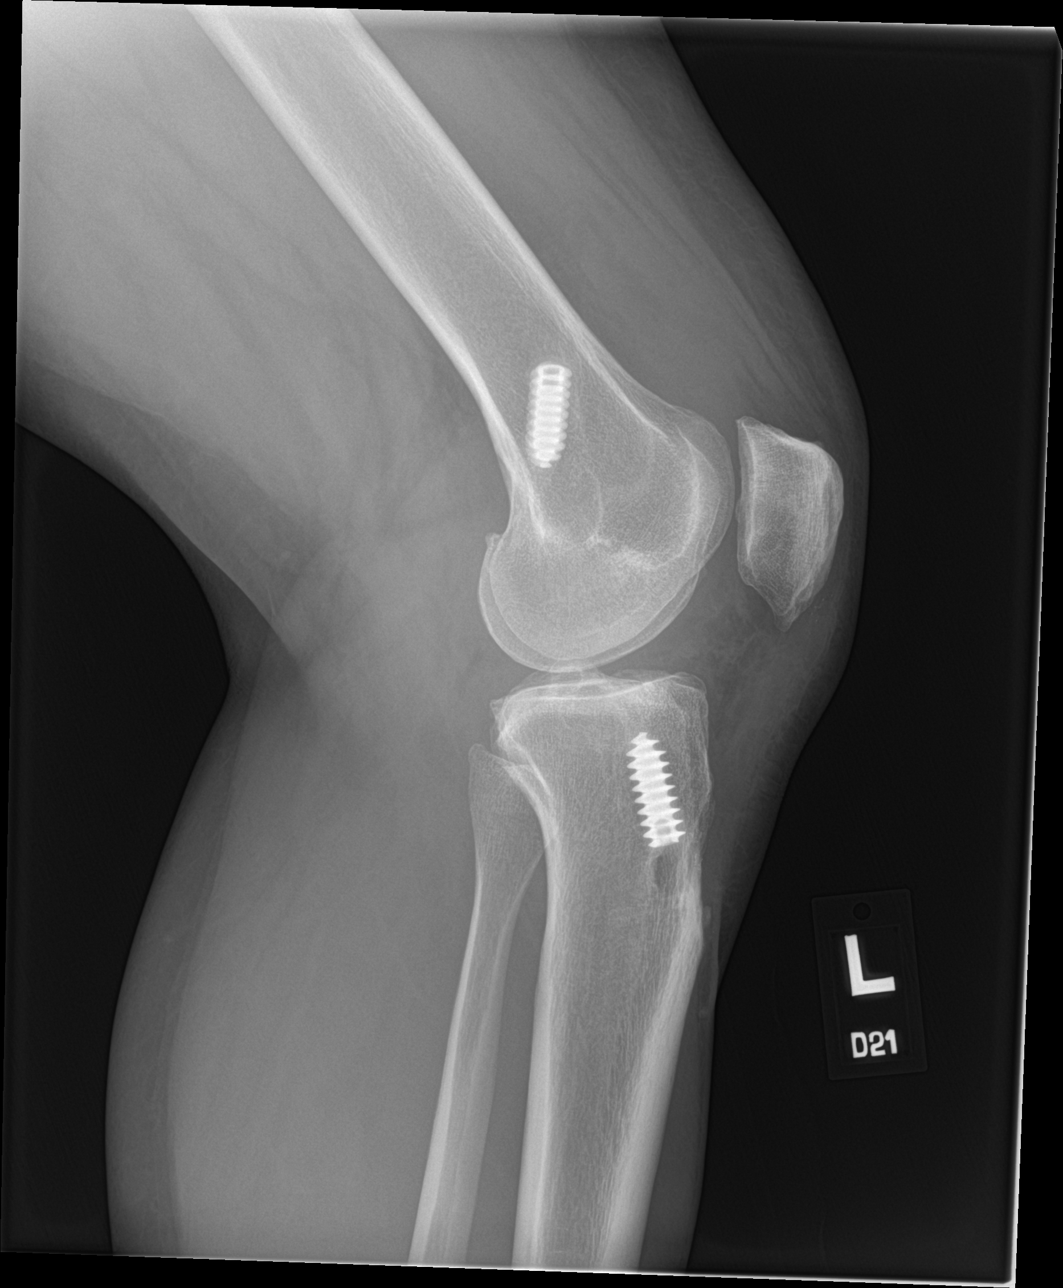

[knee obl (1 of 2)]
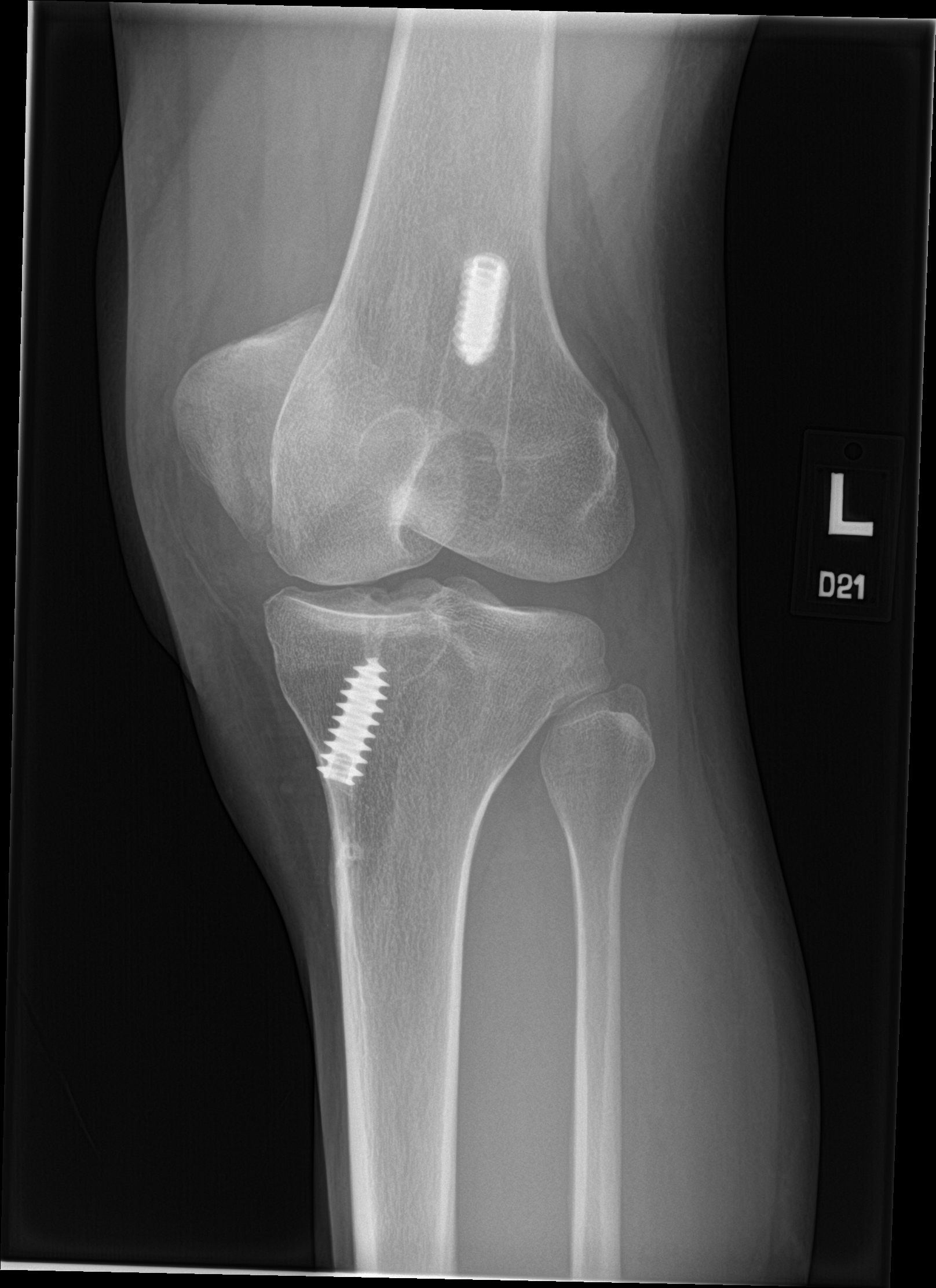

[knee obl (2 of 2)]
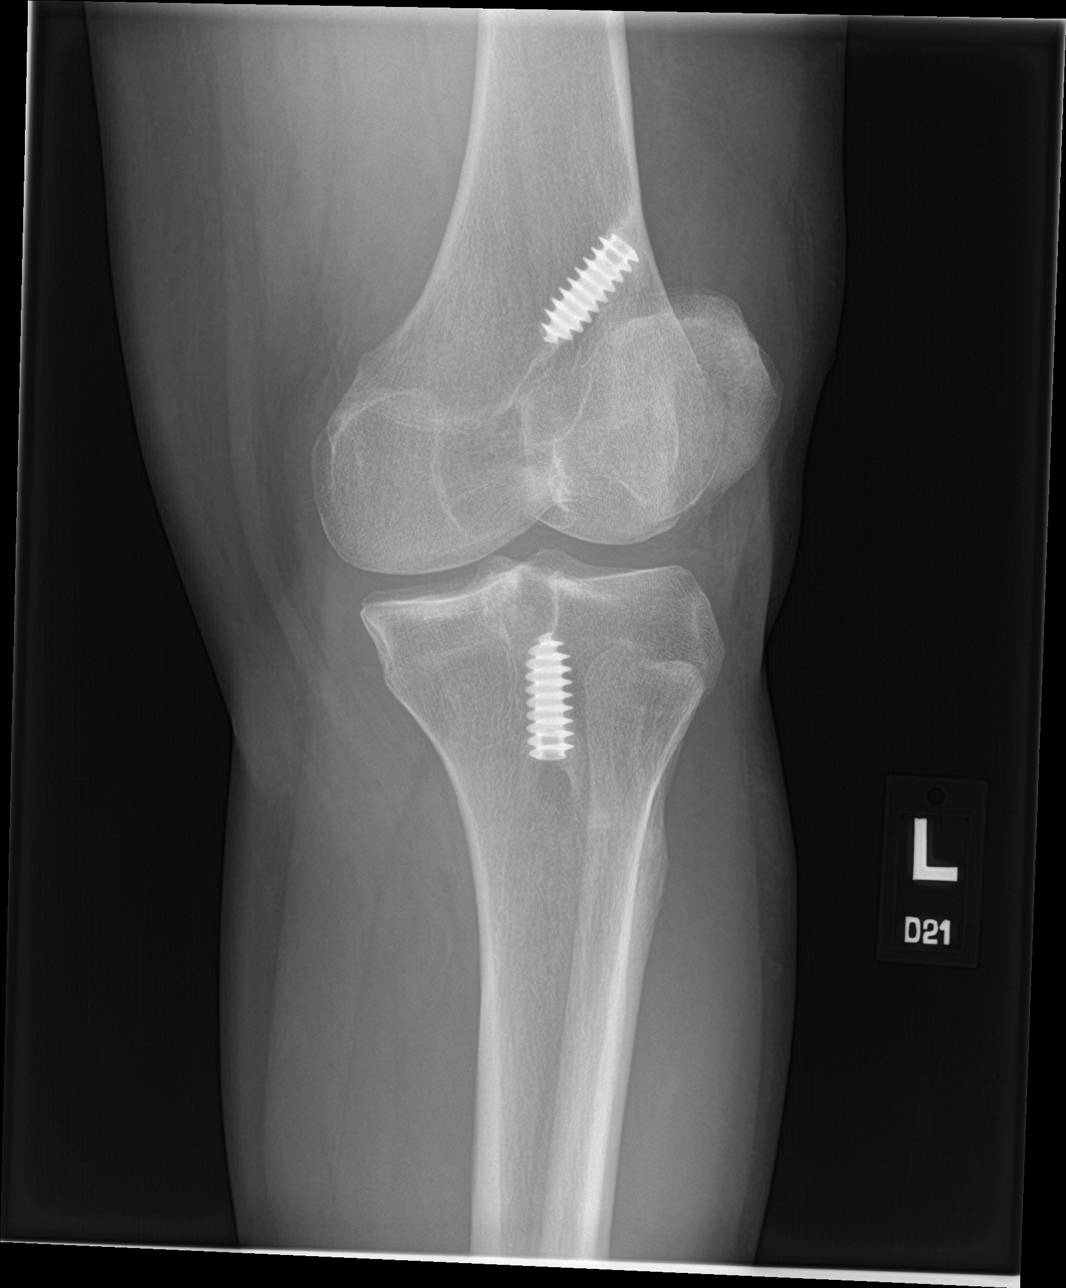

[4 of 4 positions shown; findings below may reference images not displayed]

FINDINGS: No evidence of fracture or dislocation. Large radiopaque surgical
screws are seen within the distal left femur and proximal left
tibia. No evidence of arthropathy or other focal bone abnormality.
There is a small to moderate size joint effusion.
IMPRESSION: 1. Postoperative changes with a small to moderate sized joint
effusion.

## 2021-03-08 ENCOUNTER — Other Ambulatory Visit: Payer: Self-pay | Admitting: Gastroenterology

## 2021-03-08 DIAGNOSIS — R9389 Abnormal findings on diagnostic imaging of other specified body structures: Secondary | ICD-10-CM

## 2021-03-30 ENCOUNTER — Other Ambulatory Visit: Payer: Self-pay

## 2021-03-30 ENCOUNTER — Ambulatory Visit
Admission: RE | Admit: 2021-03-30 | Discharge: 2021-03-30 | Disposition: A | Discharge status: Home / Self Care | Payer: 59 | Source: Ambulatory Visit | Attending: Gastroenterology | Admitting: Gastroenterology

## 2021-03-30 DIAGNOSIS — R9389 Abnormal findings on diagnostic imaging of other specified body structures: Secondary | ICD-10-CM

## 2021-03-30 MED ORDER — IOPAMIDOL (ISOVUE-300) INJECTION 61%
100.0000 mL | Freq: Once | INTRAVENOUS | Status: AC | PRN
  Administered 2021-03-30: 09:00:00 100 mL via INTRAVENOUS

## 2021-04-05 ENCOUNTER — Telehealth: Payer: Self-pay | Admitting: Internal Medicine

## 2021-04-05 NOTE — Telephone Encounter (Signed)
Patient requesting a call back to discuss 03-30-2021 CT results

## 2021-04-05 NOTE — Telephone Encounter (Signed)
Pt has been informed to follow up with GI specialist Dr. Clarene Essex for results. She expressed understanding.

## 2021-07-05 ENCOUNTER — Ambulatory Visit: Payer: Self-pay | Admitting: Obstetrics and Gynecology

## 2021-07-31 ENCOUNTER — Encounter: Payer: Self-pay | Admitting: Internal Medicine

## 2021-07-31 ENCOUNTER — Ambulatory Visit (INDEPENDENT_AMBULATORY_CARE_PROVIDER_SITE_OTHER): Payer: Self-pay | Admitting: Internal Medicine

## 2021-07-31 VITALS — BP 138/86 | HR 72 | Temp 98.5°F | Resp 16 | Ht 63.25 in | Wt 124.0 lb

## 2021-07-31 DIAGNOSIS — N1831 Chronic kidney disease, stage 3a: Secondary | ICD-10-CM

## 2021-07-31 DIAGNOSIS — Z23 Encounter for immunization: Secondary | ICD-10-CM

## 2021-07-31 DIAGNOSIS — I10 Essential (primary) hypertension: Secondary | ICD-10-CM

## 2021-07-31 MED ORDER — CARVEDILOL 6.25 MG PO TABS: 6.2500 mg | ORAL_TABLET | Freq: Two times a day (BID) | ORAL | 1 refills | Status: DC

## 2021-07-31 MED ORDER — SHINGRIX 50 MCG/0.5ML IM SUSR: 0.5000 mL | Freq: Once | INTRAMUSCULAR | 1 refills | Status: AC

## 2021-07-31 NOTE — Progress Notes (Signed)
? ?Subjective:  ?Patient ID: Tina Arroyo, female    DOB: 28-Nov-1965  Age: 56 y.o. MRN: [id number] ? ?CC: Hypertension ? ? ?HPI ?Tina Arroyo presents for f/up - ? ?She has seen GI for melena and says the work-up so far has been unremarkable. She denies abd pain, nauseas, or vomiting. ? ?Outpatient Medications Prior to Visit  ?Medication Sig Dispense Refill  ? Cholecalciferol 50 MCG (2000 UT) TABS Take 2 tablets (4,000 Units total) by mouth daily. 180 tablet 0  ? esomeprazole (NEXIUM) 40 MG capsule Take 1 capsule (40 mg total) by mouth daily. 90 capsule 1  ? carvedilol (COREG) 6.25 MG tablet Take 1 tablet (6.25 mg total) by mouth 2 (two) times daily with a meal. 180 tablet 0  ? ?No facility-administered medications prior to visit.  ? ? ?ROS ?Review of Systems  ?Constitutional: Negative.  Negative for diaphoresis, fatigue and unexpected weight change.  ?HENT: Negative.    ?Eyes: Negative.   ?Respiratory:  Negative for cough, chest tightness, shortness of breath and wheezing.   ?Cardiovascular:  Negative for chest pain, palpitations and leg swelling.  ?Gastrointestinal:  Negative for abdominal pain, constipation, diarrhea and vomiting.  ?Endocrine: Negative.   ?Genitourinary: Negative.  Negative for difficulty urinating.  ?Musculoskeletal: Negative.  Negative for arthralgias.  ?Skin: Negative.   ?Neurological:  Negative for dizziness, weakness, light-headedness and headaches.  ?Hematological:  Negative for adenopathy. Does not bruise/bleed easily.  ?Psychiatric/Behavioral: Negative.    ? ?Objective:  ?BP 138/86 (BP Location: Left Arm, Patient Position: Sitting, Cuff Size: Large)   Pulse 72   Temp 98.5 ?F (36.9 ?C) (Oral)   Resp 16   Ht 5' 3.25" (1.607 m)   Wt 124 lb (56.2 kg)   SpO2 97%   BMI 21.79 kg/m?  ? ?BP Readings from Last 3 Encounters:  ?07/31/21 138/86  ?01/03/21 (!) 152/96  ?12/22/20 132/80  ? ? ?Wt Readings from Last 3 Encounters:  ?07/31/21 124 lb (56.2 kg)  ?01/03/21 123 lb (55.8 kg)   ?12/22/20 122 lb (55.3 kg)  ? ? ?Physical Exam ?Vitals reviewed.  ?HENT:  ?   Nose: Nose normal.  ?   Mouth/Throat:  ?   Mouth: Mucous membranes are moist.  ?Eyes:  ?   General: No scleral icterus. ?   Conjunctiva/sclera: Conjunctivae normal.  ?Cardiovascular:  ?   Rate and Rhythm: Normal rate and regular rhythm.  ?   Heart sounds: No murmur heard. ?Pulmonary:  ?   Effort: Pulmonary effort is normal.  ?   Breath sounds: No stridor. No wheezing, rhonchi or rales.  ?Abdominal:  ?   General: Abdomen is flat.  ?   Palpations: There is no mass.  ?   Tenderness: There is no abdominal tenderness. There is no guarding.  ?   Hernia: No hernia is present.  ?Musculoskeletal:     ?   General: Normal range of motion.  ?   Cervical back: Neck supple.  ?   Right lower leg: No edema.  ?   Left lower leg: No edema.  ?Lymphadenopathy:  ?   Cervical: No cervical adenopathy.  ?Skin: ?   General: Skin is warm and dry.  ?   Coloration: Skin is not pale.  ?Neurological:  ?   General: No focal deficit present.  ?   Mental Status: She is alert.  ?Psychiatric:     ?   Mood and Affect: Mood normal.     ?   Behavior: Behavior normal.  ? ? ?  Lab Results  ?Component Value Date  ? WBC 4.5 01/03/2021  ? HGB 12.3 01/03/2021  ? HCT 36.7 01/03/2021  ? PLT 198.0 01/03/2021  ? GLUCOSE 89 12/22/2020  ? CHOL 184 09/05/2020  ? TRIG 87.0 09/05/2020  ? HDL 52.20 09/05/2020  ? LDLCALC 115 (H) 09/05/2020  ? ALT 19 09/05/2020  ? AST 23 09/05/2020  ? NA 142 12/22/2020  ? K 4.2 12/22/2020  ? CL 106 12/22/2020  ? CREATININE 0.95 12/22/2020  ? BUN 19 12/22/2020  ? CO2 32 12/22/2020  ? TSH 0.65 09/05/2020  ? HGBA1C 5.3 12/30/2015  ? ? ?CT ABDOMEN PELVIS W CONTRAST ? ?Result Date: 03/30/2021 ?CLINICAL DATA:  Abdominal pain for 3 months. EXAM: CT ABDOMEN AND PELVIS WITH CONTRAST TECHNIQUE: Multidetector CT imaging of the abdomen and pelvis was performed using the standard protocol following bolus administration of intravenous contrast. CONTRAST:  16m ISOVUE-300  IOPAMIDOL (ISOVUE-300) INJECTION 61% COMPARISON:  None. FINDINGS: Lower Chest: No acute findings. Hepatobiliary: No hepatic masses identified. Gallbladder is unremarkable. No evidence of biliary ductal dilatation. Pancreas:  No mass or inflammatory changes. Spleen: Within normal limits in size and appearance. Adrenals/Urinary Tract: No masses identified. No evidence of ureteral calculi or hydronephrosis. Diffuse bladder wall thickening is seen, suspicious for cystitis. Stomach/Bowel: No evidence of obstruction, inflammatory process or abnormal fluid collections. Normal appendix visualized. Vascular/Lymphatic: No pathologically enlarged lymph nodes. No acute vascular findings. Aortic atherosclerotic calcification noted. Reproductive: Prior hysterectomy noted. Adnexal regions are unremarkable in appearance. Other:  None. Musculoskeletal:  No suspicious bone lesions identified. IMPRESSION: Diffuse bladder wall thickening, suspicious for cystitis. Recommend correlation with urinalysis, and consider cystoscopy for further evaluation. No other significant abnormality identified. Aortic Atherosclerosis (ICD10-I70.0). Electronically Signed   By: JMarlaine HindM.D.   On: 03/30/2021 16:44  ? ? ?Assessment & Plan:  ? ?VSaumyawas seen today for hypertension. ? ?Diagnoses and all orders for this visit: ? ?Primary hypertension- Her BP is well controlled. ?-     carvedilol (COREG) 6.25 MG tablet; Take 1 tablet (6.25 mg total) by mouth 2 (two) times daily with a meal. ? ?Need for prophylactic vaccination and inoculation against varicella ?-     Zoster Vaccine Adjuvanted ([facility] injection; Inject 0.5 mLs into the muscle once for 1 dose. ? ?Stage 3a [place] She is avoiding nephrotoxic agents. ? ? ?I am having [name] start on Shingrix. I am also having her maintain her esomeprazole, Cholecalciferol, and carvedilol. ? ?Meds ordered this encounter  ?Medications  ? carvedilol (COREG) 6.25 MG tablet  ?   Sig: Take 1 tablet (6.25 mg total) by mouth 2 (two) times daily with a meal.  ?  Dispense:  180 tablet  ?  Refill:  1  ? Zoster Vaccine Adjuvanted ([facility] injection  ?  Sig: Inject 0.5 mLs into the muscle once for 1 dose.  ?  Dispense:  0.5 mL  ?  Refill:  1  ? ? ? ?Follow-up: Return in about 6 months (around 01/31/2022). ? ?TScarlette Calico MD ?

## 2021-07-31 NOTE — Patient Instructions (Signed)
Hypertension, Adult High blood pressure (hypertension) is when the force of blood pumping through the arteries is too strong. The arteries are the blood vessels that carry blood from the heart throughout the body. Hypertension forces the heart to work harder to pump blood and may cause arteries to become narrow or stiff. Untreated or uncontrolled hypertension can lead to a heart attack, heart failure, a stroke, kidney disease, and other problems. A blood pressure reading consists of a higher number over a lower number. Ideally, your blood pressure should be below 120/80. The first ("top") number is called the systolic pressure. It is a measure of the pressure in your arteries as your heart beats. The second ("bottom") number is called the diastolic pressure. It is a measure of the pressure in your arteries as the heart relaxes. What are the causes? The exact cause of this condition is not known. There are some conditions that result in high blood pressure. What increases the risk? Certain factors may make you more likely to develop high blood pressure. Some of these risk factors are under your control, including: Smoking. Not getting enough exercise or physical activity. Being overweight. Having too much fat, sugar, calories, or salt (sodium) in your diet. Drinking too much alcohol. Other risk factors include: Having a personal history of heart disease, diabetes, high cholesterol, or kidney disease. Stress. Having a family history of high blood pressure and high cholesterol. Having obstructive sleep apnea. Age. The risk increases with age. What are the signs or symptoms? High blood pressure may not cause symptoms. Very high blood pressure (hypertensive crisis) may cause: Headache. Fast or irregular heartbeats (palpitations). Shortness of breath. Nosebleed. Nausea and vomiting. Vision changes. Severe chest pain, dizziness, and seizures. How is this diagnosed? This condition is diagnosed by  measuring your blood pressure while you are seated, with your arm resting on a flat surface, your legs uncrossed, and your feet flat on the floor. The cuff of the blood pressure monitor will be placed directly against the skin of your upper arm at the level of your heart. Blood pressure should be measured at least twice using the same arm. Certain conditions can cause a difference in blood pressure between your right and left arms. If you have a high blood pressure reading during one visit or you have normal blood pressure with other risk factors, you may be asked to: Return on a different day to have your blood pressure checked again. Monitor your blood pressure at home for 1 week or longer. If you are diagnosed with hypertension, you may have other blood or imaging tests to help your health care provider understand your overall risk for other conditions. How is this treated? This condition is treated by making healthy lifestyle changes, such as eating healthy foods, exercising more, and reducing your alcohol intake. You may be referred for counseling on a healthy diet and physical activity. Your health care provider may prescribe medicine if lifestyle changes are not enough to get your blood pressure under control and if: Your systolic blood pressure is above 130. Your diastolic blood pressure is above 80. Your personal target blood pressure may vary depending on your medical conditions, your age, and other factors. Follow these instructions at home: Eating and drinking  Eat a diet that is high in fiber and potassium, and low in sodium, added sugar, and fat. An example of this eating plan is called the DASH diet. DASH stands for Dietary Approaches to Stop Hypertension. To eat this way: Eat   plenty of fresh fruits and vegetables. Try to fill one half of your plate at each meal with fruits and vegetables. Eat whole grains, such as whole-wheat pasta, brown rice, or whole-grain bread. Fill about one  fourth of your plate with whole grains. Eat or drink low-fat dairy products, such as skim milk or low-fat yogurt. Avoid fatty cuts of meat, processed or cured meats, and poultry with skin. Fill about one fourth of your plate with lean proteins, such as fish, chicken without skin, beans, eggs, or tofu. Avoid pre-made and processed foods. These tend to be higher in sodium, added sugar, and fat. Reduce your daily sodium intake. Many people with hypertension should eat less than 1,500 mg of sodium a day. Do not drink alcohol if: Your health care provider tells you not to drink. You are pregnant, may be pregnant, or are planning to become pregnant. If you drink alcohol: Limit how much you have to: 0-1 drink a day for women. 0-2 drinks a day for men. Know how much alcohol is in your drink. In the [place] one drink equals one 12 oz bottle of beer (355 mL), one 5 oz glass of wine (148 mL), or one 1 oz glass of hard liquor (44 mL). Lifestyle  Work with your health care provider to maintain a healthy body weight or to lose weight. Ask what an ideal weight is for you. Get at least 30 minutes of exercise that causes your heart to beat faster (aerobic exercise) most days of the week. Activities may include walking, swimming, or biking. Include exercise to strengthen your muscles (resistance exercise), such as Pilates or lifting weights, as part of your weekly exercise routine. Try to do these types of exercises for 30 minutes at least 3 days a week. Do not use any products that contain nicotine or tobacco. These products include cigarettes, chewing tobacco, and vaping devices, such as e-cigarettes. If you need help quitting, ask your health care provider. Monitor your blood pressure at home as told by your health care provider. Keep all follow-up visits. This is important. Medicines Take over-the-counter and prescription medicines only as told by your health care provider. Follow directions carefully. Blood  pressure medicines must be taken as prescribed. Do not skip doses of blood pressure medicine. Doing this puts you at risk for problems and can make the medicine less effective. Ask your health care provider about side effects or reactions to medicines that you should watch for. Contact a health care provider if you: Think you are having a reaction to a medicine you are taking. Have headaches that keep coming back (recurring). Feel dizzy. Have swelling in your ankles. Have trouble with your vision. Get help right away if you: Develop a severe headache or confusion. Have unusual weakness or numbness. Feel faint. Have severe pain in your chest or abdomen. Vomit repeatedly. Have trouble breathing. These symptoms may be an emergency. Get help right away. Call 911. Do not wait to see if the symptoms will go away. Do not drive yourself to the hospital. Summary Hypertension is when the force of blood pumping through your arteries is too strong. If this condition is not controlled, it may put you at risk for serious complications. Your personal target blood pressure may vary depending on your medical conditions, your age, and other factors. For most people, a normal blood pressure is less than 120/80. Hypertension is treated with lifestyle changes, medicines, or a combination of both. Lifestyle changes include losing weight, eating a healthy,   low-sodium diet, exercising more, and limiting alcohol. This information is not intended to replace advice given to you by your health care provider. Make sure you discuss any questions you have with your health care provider. Document Revised: 01/24/2021 Document Reviewed: 01/24/2021 Elsevier Patient Education  2023 [employer]  

## 2021-09-06 ENCOUNTER — Ambulatory Visit: Payer: Self-pay | Admitting: Internal Medicine

## 2021-09-20 ENCOUNTER — Encounter: Payer: Self-pay | Admitting: Internal Medicine

## 2022-01-18 ENCOUNTER — Other Ambulatory Visit: Payer: Self-pay | Admitting: Internal Medicine

## 2022-01-18 DIAGNOSIS — I10 Essential (primary) hypertension: Secondary | ICD-10-CM

## 2023-04-22 ENCOUNTER — Ambulatory Visit: Payer: Self-pay | Admitting: Family Medicine

## 2023-04-25 ENCOUNTER — Other Ambulatory Visit (HOSPITAL_COMMUNITY)
Admission: RE | Admit: 2023-04-25 | Discharge: 2023-04-25 | Disposition: A | Discharge status: Home / Self Care | Payer: Medicaid Other | Source: Ambulatory Visit | Attending: Internal Medicine | Admitting: Internal Medicine

## 2023-04-25 ENCOUNTER — Ambulatory Visit: Payer: Medicaid Other | Admitting: Internal Medicine

## 2023-04-25 ENCOUNTER — Encounter: Payer: Self-pay | Admitting: Internal Medicine

## 2023-04-25 VITALS — BP 188/106 | HR 78 | Temp 98.1°F | Resp 16 | Ht 63.25 in | Wt 124.2 lb

## 2023-04-25 DIAGNOSIS — Z23 Encounter for immunization: Secondary | ICD-10-CM

## 2023-04-25 DIAGNOSIS — I10 Essential (primary) hypertension: Secondary | ICD-10-CM | POA: Diagnosis not present

## 2023-04-25 DIAGNOSIS — E785 Hyperlipidemia, unspecified: Secondary | ICD-10-CM | POA: Diagnosis not present

## 2023-04-25 DIAGNOSIS — Z1231 Encounter for screening mammogram for malignant neoplasm of breast: Secondary | ICD-10-CM

## 2023-04-25 DIAGNOSIS — N898 Other specified noninflammatory disorders of vagina: Secondary | ICD-10-CM | POA: Diagnosis not present

## 2023-04-25 DIAGNOSIS — E559 Vitamin D deficiency, unspecified: Secondary | ICD-10-CM

## 2023-04-25 DIAGNOSIS — N1831 Chronic kidney disease, stage 3a: Secondary | ICD-10-CM | POA: Diagnosis not present

## 2023-04-25 DIAGNOSIS — K2101 Gastro-esophageal reflux disease with esophagitis, with bleeding: Secondary | ICD-10-CM

## 2023-04-25 NOTE — Patient Instructions (Signed)
Hypertension, Adult High blood pressure (hypertension) is when the force of blood pumping through the arteries is too strong. The arteries are the blood vessels that carry blood from the heart throughout the body. Hypertension forces the heart to work harder to pump blood and may cause arteries to become narrow or stiff. Untreated or uncontrolled hypertension can lead to a heart attack, heart failure, a stroke, kidney disease, and other problems. A blood pressure reading consists of a higher number over a lower number. Ideally, your blood pressure should be below 120/80. The first ("top") number is called the systolic pressure. It is a measure of the pressure in your arteries as your heart beats. The second ("bottom") number is called the diastolic pressure. It is a measure of the pressure in your arteries as the heart relaxes. What are the causes? The exact cause of this condition is not known. There are some conditions that result in high blood pressure. What increases the risk? Certain factors may make you more likely to develop high blood pressure. Some of these risk factors are under your control, including: Smoking. Not getting enough exercise or physical activity. Being overweight. Having too much fat, sugar, calories, or salt (sodium) in your diet. Drinking too much alcohol. Other risk factors include: Having a personal history of heart disease, diabetes, high cholesterol, or kidney disease. Stress. Having a family history of high blood pressure and high cholesterol. Having obstructive sleep apnea. Age. The risk increases with age. What are the signs or symptoms? High blood pressure may not cause symptoms. Very high blood pressure (hypertensive crisis) may cause: Headache. Fast or irregular heartbeats (palpitations). Shortness of breath. Nosebleed. Nausea and vomiting. Vision changes. Severe chest pain, dizziness, and seizures. How is this diagnosed? This condition is diagnosed by  measuring your blood pressure while you are seated, with your arm resting on a flat surface, your legs uncrossed, and your feet flat on the floor. The cuff of the blood pressure monitor will be placed directly against the skin of your upper arm at the level of your heart. Blood pressure should be measured at least twice using the same arm. Certain conditions can cause a difference in blood pressure between your right and left arms. If you have a high blood pressure reading during one visit or you have normal blood pressure with other risk factors, you may be asked to: Return on a different day to have your blood pressure checked again. Monitor your blood pressure at home for 1 week or longer. If you are diagnosed with hypertension, you may have other blood or imaging tests to help your health care provider understand your overall risk for other conditions. How is this treated? This condition is treated by making healthy lifestyle changes, such as eating healthy foods, exercising more, and reducing your alcohol intake. You may be referred for counseling on a healthy diet and physical activity. Your health care provider may prescribe medicine if lifestyle changes are not enough to get your blood pressure under control and if: Your systolic blood pressure is above 130. Your diastolic blood pressure is above 80. Your personal target blood pressure may vary depending on your medical conditions, your age, and other factors. Follow these instructions at home: Eating and drinking  Eat a diet that is high in fiber and potassium, and low in sodium, added sugar, and fat. An example of this eating plan is called the DASH diet. DASH stands for Dietary Approaches to Stop Hypertension. To eat this way: Eat   plenty of fresh fruits and vegetables. Try to fill one half of your plate at each meal with fruits and vegetables. Eat whole grains, such as whole-wheat pasta, brown rice, or whole-grain bread. Fill about one  fourth of your plate with whole grains. Eat or drink low-fat dairy products, such as skim milk or low-fat yogurt. Avoid fatty cuts of meat, processed or cured meats, and poultry with skin. Fill about one fourth of your plate with lean proteins, such as fish, chicken without skin, beans, eggs, or tofu. Avoid pre-made and processed foods. These tend to be higher in sodium, added sugar, and fat. Reduce your daily sodium intake. Many people with hypertension should eat less than 1,500 mg of sodium a day. Do not drink alcohol if: Your health care provider tells you not to drink. You are pregnant, may be pregnant, or are planning to become pregnant. If you drink alcohol: Limit how much you have to: 0-1 drink a day for women. 0-2 drinks a day for men. Know how much alcohol is in your drink. In the [place] one drink equals one 12 oz bottle of beer (355 mL), one 5 oz glass of wine (148 mL), or one 1 oz glass of hard liquor (44 mL). Lifestyle  Work with your health care provider to maintain a healthy body weight or to lose weight. Ask what an ideal weight is for you. Get at least 30 minutes of exercise that causes your heart to beat faster (aerobic exercise) most days of the week. Activities may include walking, swimming, or biking. Include exercise to strengthen your muscles (resistance exercise), such as Pilates or lifting weights, as part of your weekly exercise routine. Try to do these types of exercises for 30 minutes at least 3 days a week. Do not use any products that contain nicotine or tobacco. These products include cigarettes, chewing tobacco, and vaping devices, such as e-cigarettes. If you need help quitting, ask your health care provider. Monitor your blood pressure at home as told by your health care provider. Keep all follow-up visits. This is important. Medicines Take over-the-counter and prescription medicines only as told by your health care provider. Follow directions carefully. Blood  pressure medicines must be taken as prescribed. Do not skip doses of blood pressure medicine. Doing this puts you at risk for problems and can make the medicine less effective. Ask your health care provider about side effects or reactions to medicines that you should watch for. Contact a health care provider if you: Think you are having a reaction to a medicine you are taking. Have headaches that keep coming back (recurring). Feel dizzy. Have swelling in your ankles. Have trouble with your vision. Get help right away if you: Develop a severe headache or confusion. Have unusual weakness or numbness. Feel faint. Have severe pain in your chest or abdomen. Vomit repeatedly. Have trouble breathing. These symptoms may be an emergency. Get help right away. Call 911. Do not wait to see if the symptoms will go away. Do not drive yourself to the hospital. Summary Hypertension is when the force of blood pumping through your arteries is too strong. If this condition is not controlled, it may put you at risk for serious complications. Your personal target blood pressure may vary depending on your medical conditions, your age, and other factors. For most people, a normal blood pressure is less than 120/80. Hypertension is treated with lifestyle changes, medicines, or a combination of both. Lifestyle changes include losing weight, eating a healthy,   low-sodium diet, exercising more, and limiting alcohol. This information is not intended to replace advice given to you by your health care provider. Make sure you discuss any questions you have with your health care provider. Document Revised: 01/24/2021 Document Reviewed: 01/24/2021 Elsevier Patient Education  2024 [employer]  

## 2023-04-25 NOTE — Progress Notes (Signed)
Subjective:  Patient ID: Tina Arroyo, female    DOB: Dec 23, 1965  Age: 58 y.o. MRN: [id number]  CC: Hypertension   HPI Tina Arroyo presents for f/up ----  Discussed the use of AI scribe software for clinical note transcription with the patient, who gave verbal consent to proceed.  History of Present Illness   The patient, with a history of intermittent blood on the TP, presents for evaluation. The bloody TP started this year and occurred four to five times the previous year. She reports concurrent headaches but denies chest pain, shortness of breath, dizziness, and lightheadedness. She also denies constipation, diarrhea, and abd pain. Her last colonoscopy was two years ago, and she has not had a Pap smear in a while. She denies regular medication use but admits to occasional ibuprofen for pain. She also admits to occasional marijuana use but denies alcohol, tobacco, and other illicit drug use. She has not yet received a flu shot this year.       Outpatient Medications Prior to Visit  Medication Sig Dispense Refill   carvedilol (COREG) 6.25 MG tablet TAKE 1 TABLET BY MOUTH TWICE DAILY WITH A MEAL (Patient not taking: Reported on 04/25/2023) 180 tablet 0   esomeprazole (NEXIUM) 40 MG capsule Take 1 capsule (40 mg total) by mouth daily. (Patient not taking: Reported on 04/25/2023) 90 capsule 1   Cholecalciferol 50 MCG (2000 UT) TABS Take 2 tablets (4,000 Units total) by mouth daily. (Patient not taking: Reported on 04/25/2023) 180 tablet 0   No facility-administered medications prior to visit.    ROS Review of Systems  Constitutional: Negative.  Negative for appetite change, diaphoresis, fatigue and unexpected weight change.  HENT: Negative.    Eyes: Negative.  Negative for visual disturbance.  Respiratory:  Negative for cough, chest tightness, shortness of breath and wheezing.   Cardiovascular:  Negative for chest pain, palpitations and leg swelling.  Gastrointestinal: Negative.   Negative for abdominal pain, constipation, diarrhea, nausea and vomiting.  Endocrine: Negative.   Genitourinary:  Positive for vaginal discharge. Negative for difficulty urinating, dysuria, hematuria, vaginal bleeding and vaginal pain.  Musculoskeletal:  Negative for arthralgias and myalgias.  Skin: Negative.   Neurological:  Positive for headaches. Negative for dizziness, weakness, light-headedness and numbness.  Hematological:  Negative for adenopathy. Does not bruise/bleed easily.  Psychiatric/Behavioral: Negative.      Objective:  BP (!) 188/106 (BP Location: Left Arm, Patient Position: Sitting, Cuff Size: Small)   Pulse 78   Temp 98.1 F (36.7 C) (Oral)   Resp 16   Ht 5' 3.25" (1.607 m)   Wt 124 lb 3.2 oz (56.3 kg)   SpO2 98%   BMI 21.83 kg/m   BP Readings from Last 3 Encounters:  04/25/23 (!) 188/106  07/31/21 138/86  01/03/21 (!) 152/96    Wt Readings from Last 3 Encounters:  04/25/23 124 lb 3.2 oz (56.3 kg)  07/31/21 124 lb (56.2 kg)  01/03/21 123 lb (55.8 kg)    Physical Exam Vitals reviewed. Exam conducted with a chaperone present [facility]).  HENT:     Nose: Nose normal.     Mouth/Throat:     Mouth: Mucous membranes are moist.  Eyes:     General: No scleral icterus.    Conjunctiva/sclera: Conjunctivae normal.  Cardiovascular:     Rate and Rhythm: Normal rate and regular rhythm.     Heart sounds: Normal heart sounds, S1 normal and S2 normal. No murmur heard.    Comments: EKG-  NSR, 69 bpm LAE, LVH No Q waves or ST/T wave changes Pulmonary:     Effort: Pulmonary effort is normal.     Breath sounds: No stridor. No wheezing, rhonchi or rales.  Abdominal:     General: Abdomen is flat.     Palpations: There is no mass.     Tenderness: There is no abdominal tenderness. There is no guarding.     Hernia: No hernia is present. There is no hernia in the left inguinal area or right inguinal area.  Genitourinary:    Exam position: Supine.     Pubic Area: No rash or  pubic lice.      Labia:        Right: No rash, tenderness, lesion or injury.        Left: No rash, tenderness, lesion or injury.      Urethra: No prolapse, urethral pain, urethral swelling or urethral lesion.     Vagina: Normal. No signs of injury and foreign body. No vaginal discharge, erythema, tenderness, bleeding, lesions or prolapsed vaginal walls.     Cervix: Normal. No cervical motion tenderness, friability, lesion, erythema, cervical bleeding or eversion.     Uterus: Absent.      Adnexa: Right adnexa normal and left adnexa normal.       Right: No mass, tenderness or fullness.         Left: No mass, tenderness or fullness.       Rectum: Guaiac result negative. External hemorrhoid present. No mass, tenderness, anal fissure or internal hemorrhoid. Normal anal tone.     Comments: Uncomplicated external anal hemorhoids Musculoskeletal:     Cervical back: Neck supple.     Right lower leg: No edema.     Left lower leg: No edema.  Lymphadenopathy:     Cervical: No cervical adenopathy.     Lower Body: No right inguinal adenopathy. No left inguinal adenopathy.  Skin:    General: Skin is warm and dry.     Findings: No rash.  Neurological:     General: No focal deficit present.     Mental Status: Mental status is at baseline.  Psychiatric:        Mood and Affect: Mood normal.        Behavior: Behavior normal.     Lab Results  Component Value Date   WBC 5.9 04/25/2023   HGB 12.3 04/25/2023   HCT 36.9 04/25/2023   PLT 244.0 04/25/2023   GLUCOSE 100 (H) 04/25/2023   CHOL 178 04/25/2023   TRIG 65.0 04/25/2023   HDL 53.60 04/25/2023   LDLCALC 112 (H) 04/25/2023   ALT 15 04/25/2023   AST 20 04/25/2023   NA 143 04/25/2023   K 3.8 04/25/2023   CL 106 04/25/2023   CREATININE 0.91 04/25/2023   BUN 13 04/25/2023   CO2 28 04/25/2023   TSH 0.99 04/25/2023   HGBA1C 5.3 12/30/2015    CT ABDOMEN PELVIS W CONTRAST Result Date: 03/30/2021 CLINICAL DATA:  Abdominal pain for 3  months. EXAM: CT ABDOMEN AND PELVIS WITH CONTRAST TECHNIQUE: Multidetector CT imaging of the abdomen and pelvis was performed using the standard protocol following bolus administration of intravenous contrast. CONTRAST:  ISOVUE-300 IOPAMIDOL (ISOVUE-300) INJECTION 61% COMPARISON:  None. FINDINGS: Lower Chest: No acute findings. Hepatobiliary: No hepatic masses identified. Gallbladder is unremarkable. No evidence of biliary ductal dilatation. Pancreas:  No mass or inflammatory changes. Spleen: Within normal limits in size and appearance. Adrenals/Urinary Tract: No masses identified. No  evidence of ureteral calculi or hydronephrosis. Diffuse bladder wall thickening is seen, suspicious for cystitis. Stomach/Bowel: No evidence of obstruction, inflammatory process or abnormal fluid collections. Normal appendix visualized. Vascular/Lymphatic: No pathologically enlarged lymph nodes. No acute vascular findings. Aortic atherosclerotic calcification noted. Reproductive: Prior hysterectomy noted. Adnexal regions are unremarkable in appearance. Other:  None. Musculoskeletal:  No suspicious bone lesions identified. IMPRESSION: Diffuse bladder wall thickening, suspicious for cystitis. Recommend correlation with urinalysis, and consider cystoscopy for further evaluation. No other significant abnormality identified. Aortic Atherosclerosis (ICD10-I70.0). Electronically Signed   By: Danae Orleans M.D.   On: 03/30/2021 16:44    Assessment & Plan:   Stage 3a chronic kidney disease (HCC)- Will avoid nephrotoxic agents  -     CBC with Differential/Platelet; Future -     Urinalysis, Routine w reflex microscopic; Future -     Basic metabolic panel; Future -     VITAMIN D 25 Hydroxy (Vit-D Deficiency, Fractures); Future  Gastroesophageal reflux disease with esophagitis and hemorrhage -     CBC with Differential/Platelet; Future -     Basic metabolic panel; Future -     POCT occult blood stool  Primary hypertension- EKG  is positive for LVH. Labs ordered to evaluate for secondary causes and endorgan damage.  Will start amlodipine, hydralazine, and spironolactone. -     CBC with Differential/Platelet; Future -     Hepatic function panel; Future -     TSH; Future -     Urinalysis, Routine w reflex microscopic; Future -     Basic metabolic panel; Future -     EKG 12-Lead -     VITAMIN D 25 Hydroxy (Vit-D Deficiency, Fractures); Future -     Aldosterone + renin activity w/ ratio; Future -     amLODIPine Besylate; Take 1 tablet (5 mg total) by mouth daily.  Dispense: 90 tablet; Refill: 0 -     hydrALAZINE HCl; Take 1 tablet (25 mg total) by mouth 3 (three) times daily.  Dispense: 270 tablet; Refill: 0 -     Spironolactone; Take 1 tablet (25 mg total) by mouth daily.  Dispense: 90 tablet; Refill: 0 -     AMB Referral VBCI Care Management  Dyslipidemia, goal LDL below 100- I have asked her to take a statin for cardiovascular risk reduction. -     Lipid panel; Future -     Hepatic function panel; Future -     TSH; Future -     Rosuvastatin Calcium; Take 1 tablet (10 mg total) by mouth daily.  Dispense: 90 tablet; Refill: 0  Vaginal discharge -     Cytology - PAP  Encounter for immunization -     Flu Vaccine Trivalent High Dose (Fluad)  Screening mammogram for breast cancer -     Digital Screening Mammogram, Left and Right; Future  Vitamin D deficiency disease -     Cholecalciferol; Take 1 tablet (2,000 Units total) by mouth daily.  Dispense: 90 tablet; Refill: 1     Follow-up: Return in about 4 weeks (around 05/23/2023).  Sanda Linger, MD

## 2023-04-26 ENCOUNTER — Ambulatory Visit: Payer: Self-pay | Admitting: Internal Medicine

## 2023-04-26 ENCOUNTER — Encounter: Payer: Self-pay | Admitting: Internal Medicine

## 2023-04-26 DIAGNOSIS — E785 Hyperlipidemia, unspecified: Secondary | ICD-10-CM | POA: Insufficient documentation

## 2023-04-26 DIAGNOSIS — N898 Other specified noninflammatory disorders of vagina: Secondary | ICD-10-CM | POA: Insufficient documentation

## 2023-04-26 LAB — CBC WITH DIFFERENTIAL/PLATELET
Basophils Absolute: 0.1 10*3/uL (ref 0.0–0.1)
Basophils Relative: 1 % (ref 0.0–3.0)
Eosinophils Absolute: 0.2 10*3/uL (ref 0.0–0.7)
Eosinophils Relative: 2.8 % (ref 0.0–5.0)
HCT: 36.9 % (ref 36.0–46.0)
Hemoglobin: 12.3 g/dL (ref 12.0–15.0)
Lymphocytes Relative: 36.5 % (ref 12.0–46.0)
Lymphs Abs: 2.1 10*3/uL (ref 0.7–4.0)
MCHC: 33.2 g/dL (ref 30.0–36.0)
MCV: 90.4 fL (ref 78.0–100.0)
Monocytes Absolute: 0.5 10*3/uL (ref 0.1–1.0)
Monocytes Relative: 7.7 % (ref 3.0–12.0)
Neutro Abs: 3.1 10*3/uL (ref 1.4–7.7)
Neutrophils Relative %: 52 % (ref 43.0–77.0)
Platelets: 244 10*3/uL (ref 150.0–400.0)
RBC: 4.09 Mil/uL (ref 3.87–5.11)
RDW: 14.2 % (ref 11.5–15.5)
WBC: 5.9 10*3/uL (ref 4.0–10.5)

## 2023-04-26 LAB — BASIC METABOLIC PANEL
BUN: 13 mg/dL (ref 6–23)
CO2: 28 meq/L (ref 19–32)
Calcium: 9 mg/dL (ref 8.4–10.5)
Chloride: 106 meq/L (ref 96–112)
Creatinine, Ser: 0.91 mg/dL (ref 0.40–1.20)
GFR: 70.29 mL/min (ref >60.00)
Glucose, Bld: 100 mg/dL — ABNORMAL HIGH (ref 70–99)
Potassium: 3.8 meq/L (ref 3.5–5.1)
Sodium: 143 meq/L (ref 135–145)

## 2023-04-26 LAB — URINALYSIS, ROUTINE W REFLEX MICROSCOPIC
Bilirubin Urine: NEGATIVE
Hgb urine dipstick: NEGATIVE
Ketones, ur: NEGATIVE
Leukocytes,Ua: NEGATIVE
Nitrite: NEGATIVE
Specific Gravity, Urine: 1.025 (ref 1.000–1.030)
Total Protein, Urine: NEGATIVE
Urine Glucose: NEGATIVE
Urobilinogen, UA: 0.2 (ref 0.0–1.0)
pH: 6 (ref 5.0–8.0)

## 2023-04-26 LAB — LIPID PANEL
Cholesterol: 178 mg/dL (ref 0–200)
HDL: 53.6 mg/dL (ref >39.00)
LDL Cholesterol: 112 mg/dL — ABNORMAL HIGH (ref 0–99)
NonHDL: 124.84
Total CHOL/HDL Ratio: 3
Triglycerides: 65 mg/dL (ref 0.0–149.0)
VLDL: 13 mg/dL (ref 0.0–40.0)

## 2023-04-26 LAB — VITAMIN D 25 HYDROXY (VIT D DEFICIENCY, FRACTURES): VITD: 18.65 ng/mL — ABNORMAL LOW (ref 30.00–100.00)

## 2023-04-26 LAB — HEPATIC FUNCTION PANEL
ALT: 15 U/L (ref 0–35)
AST: 20 U/L (ref 0–37)
Albumin: 4.3 g/dL (ref 3.5–5.2)
Alkaline Phosphatase: 68 U/L (ref 39–117)
Bilirubin, Direct: 0.1 mg/dL (ref 0.0–0.3)
Total Bilirubin: 0.4 mg/dL (ref 0.2–1.2)
Total Protein: 7.3 g/dL (ref 6.0–8.3)

## 2023-04-26 LAB — TSH: TSH: 0.99 u[IU]/mL (ref 0.35–5.50)

## 2023-04-26 MED ORDER — AMLODIPINE BESYLATE 5 MG PO TABS: 5.0000 mg | ORAL_TABLET | Freq: Every day | ORAL | 0 refills | Status: DC

## 2023-04-26 MED ORDER — SPIRONOLACTONE 25 MG PO TABS: 25.0000 mg | ORAL_TABLET | Freq: Every day | ORAL | 0 refills | Status: DC

## 2023-04-26 MED ORDER — HYDRALAZINE HCL 25 MG PO TABS: 25.0000 mg | ORAL_TABLET | Freq: Three times a day (TID) | ORAL | 0 refills | Status: DC

## 2023-04-26 MED ORDER — CHOLECALCIFEROL 50 MCG (2000 UT) PO TABS: 1.0000 | ORAL_TABLET | Freq: Every day | ORAL | 1 refills | Status: DC

## 2023-04-26 MED ORDER — ROSUVASTATIN CALCIUM 10 MG PO TABS: 10.0000 mg | ORAL_TABLET | Freq: Every day | ORAL | 0 refills | Status: DC

## 2023-04-26 NOTE — Telephone Encounter (Signed)
Medication was sent to local pharmacy today. She has been advised and gave a verbal understanding.

## 2023-04-26 NOTE — Telephone Encounter (Signed)
Pt unsure which BP medication the doctor wanted to prescribe, she thinks its her carvedilol, however, no antihypertensive are at her pharm. Pt would still like to use the [facility] neighborhood in [place].  Copied from CRM [id number]. Topic: Clinical - Medication Question >> Apr 26, 2023  8:10 AM Gurney Maxin H wrote: Reason for CRM: Patient called in stating she seen provider on 1/23 and provider was going to send in some medication for blood pressure to the [facility] but nothing at pharmacy for patient. Patient not sure of medication name, please reach out to patient for clarity.  Ernie Avena [phone]

## 2023-04-29 ENCOUNTER — Telehealth: Payer: Self-pay

## 2023-04-29 NOTE — Progress Notes (Signed)
Care Guide Pharmacy Note  04/29/2023 Name: Tina Arroyo MRN: [id number] DOB: Jan 11, 1966  Referred By: Etta Grandchild, MD Reason for referral: Care Coordination (Outreach to schedule with Pharm d )   Tina Arroyo is a 58 y.o. year old female who is a primary care patient of Etta Grandchild, MD.  [name] was referred to the pharmacist for assistance related to: HTN  Successful contact was made with the patient to discuss pharmacy services including being ready for the pharmacist to call at least 5 minutes before the scheduled appointment time and to have medication bottles and any blood pressure readings ready for review. The patient agreed to meet with the pharmacist via telephone visit on (date/time).05/01/2023  Penne Lash , RMA       [facility], [facility] Guide  Direct Dial: [phone]  Website: [email or web address]

## 2023-04-29 NOTE — Progress Notes (Signed)
Care Guide Pharmacy Note  04/29/2023 Name: [name] MRN: [id number] DOB: March 14, 1966  Referred By: Etta Grandchild, MD Reason for referral: Care Coordination (Outreach to schedule with Pharm d )   Tina Arroyo is a 58 y.o. year old female who is a primary care patient of Etta Grandchild, MD.  [name] was referred to the pharmacist for assistance related to: HTN  An unsuccessful telephone outreach was attempted today to contact the patient who was referred to the pharmacy team for assistance with medication management. Additional attempts will be made to contact the patient.  Penne Lash , RMA     [facility]  [facility], [facility] Guide  Direct Dial: [phone]  Website: [email or web address]

## 2023-05-01 ENCOUNTER — Other Ambulatory Visit: Payer: Medicaid Other | Admitting: Pharmacist

## 2023-05-01 DIAGNOSIS — I10 Essential (primary) hypertension: Secondary | ICD-10-CM | POA: Diagnosis not present

## 2023-05-01 MED ORDER — BLOOD PRESSURE CUFF MISC: 1.0000 | Freq: Every day | 0 refills | Status: DC

## 2023-05-01 NOTE — Progress Notes (Signed)
05/01/2023 Name: Tina Arroyo MRN: [id number] DOB: 1965/09/24  Chief Complaint  Patient presents with   Hypertension   Medication Management    Tina Arroyo is a 58 y.o. year old female who presented for a telephone visit.   They were referred to the pharmacist by their PCP for assistance in managing hypertension.   Subjective:  Care Team: Primary Care Provider: Etta Grandchild, MD ; Next Scheduled Visit: none scheduled  Medication Access/Adherence  Current Pharmacy:  [facility] [address] [place] - [address] [place] [address] Phone: [phone] Fax: [phone]   Patient reports affordability concerns with their medications: No  Patient reports access/transportation concerns to their pharmacy: No  Patient reports adherence concerns with their medications:  No     Hypertension:  Current medications: amlodipine 5 mg daily, spironolactone 25 mg daily, hydralazine 25 mg three times daily (just started all 3 on Saturday 1/25)   Patient does not have a validated, automated, upper arm home BP cuff Current blood pressure readings: none    Objective:  BP Readings from Last 3 Encounters:  04/25/23 (!) 188/106  07/31/21 138/86  01/03/21 (!) 152/96     Lab Results  Component Value Date   HGBA1C 5.3 12/30/2015    Lab Results  Component Value Date   CREATININE 0.91 04/25/2023   BUN 13 04/25/2023   NA 143 04/25/2023   K 3.8 04/25/2023   CL 106 04/25/2023   CO2 28 04/25/2023    Lab Results  Component Value Date   CHOL 178 04/25/2023   HDL 53.60 04/25/2023   LDLCALC 112 (H) 04/25/2023   TRIG 65.0 04/25/2023   CHOLHDL 3 04/25/2023    Medications Reviewed Today     Reviewed by Bonita Quin, RPH (Pharmacist) on 05/01/23 at 1622  Med List Status: <None>   Medication Order Taking? Sig Documenting Provider Last Dose Status Informant  amLODipine (NORVASC) 5 MG tablet [id number] Yes Take 1 tablet  (5 mg total) by mouth daily. Etta Grandchild, MD Taking Active   Cholecalciferol 50 MCG (2000 UT) TABS [id number] No Take 1 tablet (2,000 Units total) by mouth daily.  Patient not taking: Reported on 05/01/2023   Etta Grandchild, MD Not Taking Active   esomeprazole (NEXIUM) 40 MG capsule [id number]  Take 1 capsule (40 mg total) by mouth daily.  Patient not taking: Reported on 04/25/2023   Etta Grandchild, MD  Active   hydrALAZINE (APRESOLINE) 25 MG tablet [id number] Yes Take 1 tablet (25 mg total) by mouth 3 (three) times daily. Etta Grandchild, MD Taking Active   rosuvastatin (CRESTOR) 10 MG tablet [id number] Yes Take 1 tablet (10 mg total) by mouth daily. Etta Grandchild, MD Taking Active   spironolactone (ALDACTONE) 25 MG tablet [id number] Yes Take 1 tablet (25 mg total) by mouth daily. Etta Grandchild, MD Taking Active   Med List Note (Delaforce, Karen Chafe, CPhT 04/21/13 1129): No preferred pharmacy              Assessment/Plan:   Hypertension: - Currently uncontrolled, BP goal <130/80 - Reviewed long term cardiovascular and renal outcomes of uncontrolled blood pressure - Will order BP cuff through [facility] under Medicaid coverage. Recommended to check home blood pressure and heart rate and keep a log - Reviewed her medications and how to take them  Vitamin D deficiency: Reviewed OTC vitamin D to get and start    Follow Up  Plan: 2/14  Arbutus Leas, PharmD, BCPS, CPP Clinical Pharmacist Practitioner [facility] [phone]

## 2023-05-01 NOTE — Patient Instructions (Signed)
It was a pleasure speaking with you today!  Take amlodipine 5 mg once daily, spironolactone 25 mg once daily in the morning, and hydralazine 25 mg three times daily for blood pressure. Take rosuvastatin 10 mg daily for cholesterol/heart protection.  Get VitaminD3 2000 units over the counter to take one daily for low vitamin D level.  Feel free to call with any questions or concerns!  Arbutus Leas, PharmD, BCPS, CPP Clinical Pharmacist Practitioner [facility] [phone]

## 2023-05-02 LAB — ALDOSTERONE + RENIN ACTIVITY W/ RATIO
Aldosterone: <1 ng/dL
Renin Activity: 0.11 ng/mL/h — ABNORMAL LOW (ref 0.25–5.82)

## 2023-05-04 ENCOUNTER — Other Ambulatory Visit: Payer: Self-pay | Admitting: Internal Medicine

## 2023-05-04 DIAGNOSIS — I10 Essential (primary) hypertension: Secondary | ICD-10-CM | POA: Insufficient documentation

## 2023-05-04 DIAGNOSIS — N1831 Chronic kidney disease, stage 3a: Secondary | ICD-10-CM

## 2023-05-04 NOTE — Progress Notes (Signed)
Please let her know that I have ordered a referral to a kidney specialist.

## 2023-05-06 ENCOUNTER — Other Ambulatory Visit: Payer: Self-pay | Admitting: Internal Medicine

## 2023-05-06 DIAGNOSIS — A599 Trichomoniasis, unspecified: Secondary | ICD-10-CM | POA: Insufficient documentation

## 2023-05-06 LAB — CYTOLOGY - PAP
Adequacy: ABSENT
Chlamydia: NEGATIVE
Comment: NEGATIVE
Comment: NEGATIVE
Comment: NEGATIVE
Comment: NEGATIVE
Comment: NORMAL
Diagnosis: NEGATIVE
HSV1: NEGATIVE
HSV2: NEGATIVE
High risk HPV: POSITIVE — AB
Neisseria Gonorrhea: NEGATIVE
Trichomonas: POSITIVE — AB

## 2023-05-06 MED ORDER — METRONIDAZOLE 500 MG PO TABS: 500.0000 mg | ORAL_TABLET | Freq: Two times a day (BID) | ORAL | 0 refills | Status: AC

## 2023-05-06 NOTE — Progress Notes (Signed)
Please let her know that there was trichomonas on her PAP. I have prescribed an antibiotic to treat this.

## 2023-05-06 NOTE — Progress Notes (Signed)
Spoke with patient, she is aware and will pick up medications.

## 2023-05-10 ENCOUNTER — Other Ambulatory Visit: Payer: Self-pay | Admitting: Internal Medicine

## 2023-05-10 DIAGNOSIS — Z09 Encounter for follow-up examination after completed treatment for conditions other than malignant neoplasm: Secondary | ICD-10-CM

## 2023-05-10 DIAGNOSIS — R921 Mammographic calcification found on diagnostic imaging of breast: Secondary | ICD-10-CM

## 2023-05-15 ENCOUNTER — Telehealth: Payer: Self-pay | Admitting: Internal Medicine

## 2023-05-15 NOTE — Telephone Encounter (Signed)
 Copied from CRM [id number]. Topic: Referral - Status >> May 15, 2023 11:12 AM Alcus Dad wrote: Reason for CRM: Patient stated that Dr. Yetta Barre referred her to a Kidney [facility]. And she has not heard anything back yet

## 2023-05-16 NOTE — Telephone Encounter (Signed)
Patient has been made aware that [facility] ranks their referrals from 1-4 with one being urgent and 4 being less urgent and once the rank her they will call her once they get to her referral. She gave a verbal understanding.

## 2023-05-17 ENCOUNTER — Other Ambulatory Visit (INDEPENDENT_AMBULATORY_CARE_PROVIDER_SITE_OTHER): Payer: Medicaid Other | Admitting: Pharmacist

## 2023-05-17 VITALS — BP 93/58

## 2023-05-17 DIAGNOSIS — I10 Essential (primary) hypertension: Secondary | ICD-10-CM

## 2023-05-17 NOTE — Progress Notes (Signed)
   05/17/2023 Name: Tina Arroyo MRN: [id number] DOB: [birth year]  Chief Complaint  Patient presents with   Hypertension   Medication Management    [name] is a 58 y.o. year old female who presented for a telephone visit.   They were referred to the pharmacist by their PCP for assistance in managing hypertension.   Subjective:  Care Team: Primary Care Provider: Etta Grandchild, MD ; Next Scheduled Visit: none scheduled  Medication Access/Adherence  Current Pharmacy:  [facility] [address], [place] - [address] [place] [address] Phone: [phone] Fax: [phone]  [facility] - [place], [place] - [address] [place] [address] Phone: [phone] Fax: [phone]   Patient reports affordability concerns with their medications: No  Patient reports access/transportation concerns to their pharmacy: No  Patient reports adherence concerns with their medications:  No     Hypertension:  Current medications: amlodipine 5 mg daily, spironolactone 25 mg daily, hydralazine 25 mg three times daily (just started all 3 on Saturday 1/25)   Patient does not have a validated, automated, upper arm home BP cuff - she has not been able to get the monitor from [facility] but has been using a friends' Current blood pressure readings: 7:50 PM 2/1 87/63 2/2 140/92 2/3 116/76 2/4 120/85 2/5 91/66 2/6 104/73 2/7 100/68 2/8 97/69 2/9 111/66 2/10 120/78 2/11 108/76 2/12 108/78 2/13 100/82  2/14 93/58 this morning  Headache she was having is gone, states she feels better in general No dizziness, weakness, fatigue reported  Objective:  BP Readings from Last 3 Encounters:  05/17/23 (!) 93/58  04/25/23 (!) 188/106  07/31/21 138/86     Lab Results  Component Value Date   HGBA1C 5.3 12/30/2015    Lab Results  Component Value Date   CREATININE 0.91 04/25/2023    BUN 13 04/25/2023   NA 143 04/25/2023   K 3.8 04/25/2023   CL 106 04/25/2023   CO2 28 04/25/2023    Lab Results  Component Value Date   CHOL 178 04/25/2023   HDL 53.60 04/25/2023   LDLCALC 112 (H) 04/25/2023   TRIG 65.0 04/25/2023   CHOLHDL 3 04/25/2023    Medications Reviewed Today   Medications were not reviewed in this encounter       Assessment/Plan:   Hypertension: - Currently over controlled, BP goal <130/80 - Reviewed long term cardiovascular and renal outcomes of uncontrolled blood pressure - Rx for BP monitor sent to [facility], recommended she call them to ask for it to be delivered -Recommended to check home blood pressure and heart rate and keep a log - Recommended to stop hydralazine. Continue amlodipine and spironolactone. - Will f/u for readings in 1 week     Follow Up Plan: 2/21  Arbutus Leas, PharmD, BCPS, CPP Clinical Pharmacist Practitioner [facility] [phone]

## 2023-05-17 NOTE — Patient Instructions (Signed)
It was a pleasure speaking with you today!  Stop taking hydralazine. Continue amlodipine and spironolactone for blood pressure. Keep checking blood pressure daily. I will call back in 1 week to go over blood pressures.  Feel free to call with any questions or concerns!  Arbutus Leas, PharmD, BCPS, CPP Clinical Pharmacist Practitioner [facility] [phone]

## 2023-05-24 ENCOUNTER — Other Ambulatory Visit: Payer: Medicaid Other | Admitting: Pharmacist

## 2023-05-24 VITALS — BP 108/78

## 2023-05-24 DIAGNOSIS — I10 Essential (primary) hypertension: Secondary | ICD-10-CM

## 2023-05-24 NOTE — Progress Notes (Signed)
 05/24/2023 Name: Tina Arroyo MRN: [id number] DOB: 12/14/1965  Chief Complaint  Patient presents with   Hypertension   Medication Management    Tina Arroyo is a 58 y.o. year old female who presented for a telephone visit.   They were referred to the pharmacist by their PCP for assistance in managing hypertension.   Subjective:  Care Team: Primary Care Provider: Etta Grandchild, MD ; Next Scheduled Visit: none scheduled  Medication Access/Adherence  Current Pharmacy:  [facility] [address], [place] - [address] [place] [address] Phone: [phone] Fax: [phone]  [facility] - [place], [place] - [address] [place] [address] Phone: [phone] Fax: [phone]   Patient reports affordability concerns with their medications: No  Patient reports access/transportation concerns to their pharmacy: No  Patient reports adherence concerns with their medications:  No     Hypertension:  Current medications: amlodipine 5 mg daily, spironolactone 25 mg daily(started on Saturday 1/25) Previous medication: hydralazine 25 mg three times daily (stopped 2/14 due to low BP)   Patient does not have a validated, automated, upper arm home BP cuff - she has not been able to get the monitor from [facility] but has been using a friends' Current blood pressure readings:  2/17 97/67 2/18 106/76 2/19 117/89 2/21 108/78  Headache she was having is gone, states she feels better in general No dizziness, weakness, fatigue reported  Objective:  BP Readings from Last 3 Encounters:  05/17/23 (!) 93/58  04/25/23 (!) 188/106  07/31/21 138/86     Lab Results  Component Value Date   HGBA1C 5.3 12/30/2015    Lab Results  Component Value Date   CREATININE 0.91 04/25/2023   BUN 13 04/25/2023   NA 143 04/25/2023   K 3.8 04/25/2023   CL 106 04/25/2023   CO2 28 04/25/2023     Lab Results  Component Value Date   CHOL 178 04/25/2023   HDL 53.60 04/25/2023   LDLCALC 112 (H) 04/25/2023   TRIG 65.0 04/25/2023   CHOLHDL 3 04/25/2023    Medications Reviewed Today     Reviewed by Tina Arroyo, RPH (Pharmacist) on 05/24/23 at 1526  Med List Status: <None>   Medication Order Taking? Sig Documenting Provider Last Dose Status Informant  amLODipine (NORVASC) 5 MG tablet [id number] Yes Take 1 tablet (5 mg total) by mouth daily. Etta Grandchild, MD Taking Active   Blood Pressure Monitoring (BLOOD PRESSURE CUFF) MISC [id number]  1 kit by Does not apply route daily. Etta Grandchild, MD  Active   Cholecalciferol 50 MCG (2000 UT) TABS [id number]  Take 1 tablet (2,000 Units total) by mouth daily.  Patient not taking: Reported on 05/01/2023   Etta Grandchild, MD  Active   esomeprazole (NEXIUM) 40 MG capsule [id number]  Take 1 capsule (40 mg total) by mouth daily.  Patient not taking: Reported on 04/25/2023   Etta Grandchild, MD  Active   rosuvastatin (CRESTOR) 10 MG tablet [id number] Yes Take 1 tablet (10 mg total) by mouth daily. Etta Grandchild, MD Taking Active   spironolactone (ALDACTONE) 25 MG tablet [id number] Yes Take 1 tablet (25 mg total) by mouth daily. Etta Grandchild, MD Taking Active   Med List Note ([employer], Tina Arroyo, CPhT 04/21/13 1129): No preferred pharmacy              Assessment/Plan:   Hypertension: -  Currently controlled, BP goal <130/80 - Reviewed long term cardiovascular and renal outcomes of uncontrolled blood pressure -Recommended to check home blood pressure and heart rate and keep a log - Consider decreasing amlodipine to 2.5 mg or stopping. Pt has appt in office next week. Will wait for in office reading and provider assessment   Follow Up Plan: f/u 2/25 PCP recs  Arbutus Leas, PharmD, BCPS, CPP Clinical Pharmacist Practitioner [facility] at [facility] [phone]

## 2023-05-24 NOTE — Patient Instructions (Signed)
 It was a pleasure speaking with you today!  Continue current regimen. Keep checking blood pressures.  Feel free to call with any questions or concerns!  Arbutus Leas, PharmD, BCPS, CPP Clinical Pharmacist Practitioner [facility] at [facility] [phone]

## 2023-05-28 ENCOUNTER — Ambulatory Visit: Payer: Medicaid Other | Admitting: Internal Medicine

## 2023-06-04 ENCOUNTER — Ambulatory Visit: Payer: Medicaid Other | Admitting: Internal Medicine

## 2023-06-04 ENCOUNTER — Encounter: Payer: Self-pay | Admitting: Internal Medicine

## 2023-06-04 VITALS — BP 118/76 | HR 71 | Temp 98.8°F | Resp 16 | Ht 63.25 in | Wt 122.2 lb

## 2023-06-04 DIAGNOSIS — K644 Residual hemorrhoidal skin tags: Secondary | ICD-10-CM | POA: Insufficient documentation

## 2023-06-04 DIAGNOSIS — N3289 Other specified disorders of bladder: Secondary | ICD-10-CM | POA: Insufficient documentation

## 2023-06-04 DIAGNOSIS — I10 Essential (primary) hypertension: Secondary | ICD-10-CM

## 2023-06-04 DIAGNOSIS — K2101 Gastro-esophageal reflux disease with esophagitis, with bleeding: Secondary | ICD-10-CM

## 2023-06-04 DIAGNOSIS — Z23 Encounter for immunization: Secondary | ICD-10-CM

## 2023-06-04 DIAGNOSIS — K5909 Other constipation: Secondary | ICD-10-CM | POA: Insufficient documentation

## 2023-06-04 DIAGNOSIS — Z860100 Personal history of colon polyps, unspecified: Secondary | ICD-10-CM | POA: Insufficient documentation

## 2023-06-04 MED ORDER — ESOMEPRAZOLE MAGNESIUM 40 MG PO CPDR: 40.0000 mg | DELAYED_RELEASE_CAPSULE | Freq: Every day | ORAL | 1 refills | Status: DC

## 2023-06-04 MED ORDER — SHINGRIX 50 MCG/0.5ML IM SUSR: 0.5000 mL | Freq: Once | INTRAMUSCULAR | 1 refills | Status: AC

## 2023-06-04 NOTE — Patient Instructions (Signed)
 GERD in Adults: What to Know  Gastroesophageal reflux (GER) is when acid from your stomach flows up into your esophagus. Your esophagus is the part of your body that moves food from your mouth to your stomach. Normally, food goes down and stays in your stomach to be digested. But with GER, food and stomach acid may go back up. You may have a disease called gastroesophageal reflux disease (GERD) if the reflux: Happens often. Causes very bad symptoms. Makes your esophagus sore and swollen. Over time, GERD can make small holes called ulcers in the lining of your esophagus. What are the causes? GERD is caused by a problem with the muscle between your esophagus and stomach. This muscle is called the lower esophageal sphincter (LES). When it's weak or not normal, it doesn't close like it should. This means food and stomach acid can go back up into your esophagus. The muscle can be weak if: You smoke or use products with tobacco in them. You're pregnant. You have a type of hernia called a hiatal hernia. You eat certain foods and drinks. These include: Alcohol. Coffee. Chocolate. Onions. Peppermint. What increases the risk? Being overweight. Having a disease that affects your connective tissue. Taking NSAIDs, such as ibuprofen. What are the signs or symptoms? Heartburn. Trouble swallowing. Pain when you swallow. The feeling of having a lump in your throat. A bitter taste in your mouth. Bad breath. Having an upset or bloated stomach. Burping. Chest pain. Other conditions can also cause chest pain. Make sure you see your health care provider if you have chest pain. Wheezing. This is when you make high-pitched whistling sounds when you breathe, most often when you breathe out. A long-term cough or a cough at night. How is this diagnosed? GERD may be diagnosed based on your medical history and a physical exam. You may also have tests. These may include: An endoscopy. This test looks at your  stomach and esophagus with a small camera. A barium swallow test. This shows the shape and size of your esophagus and how well it's working. Tests of your esophagus to check for: Acid levels. Pressure. How is this treated? Treatment may depend on how bad your symptoms are. It may include: Changes to your diet and daily life. Medicines. Surgery. Follow these instructions at home: Eating and drinking Follow an eating plan as told by your provider. You may need to avoid certain foods and drinks. These may include: Coffee and tea, with or without caffeine. Alcohol. Energy drinks and sports drinks. Fizzy drinks or sodas. Chocolate and cocoa. Peppermint and mint flavorings. Garlic and onions. Horseradish. Spicy and acidic foods. These include: Peppers. Chili powder and curry powder. Vinegar. Hot sauces and BBQ sauce. Citrus fruits and juices. These include: Oranges. Lemons. Limes. Tomato-based foods. These include: Red sauce and pizza with red sauce. Chili. Salsa. Fried and fatty foods. These include: Donuts. [place] fries. Potato chips. High-fat dressings. High-fat meats. These include: Hot dogs and sausage. Rib eye steak. Ham and bacon. High-fat dairy items. These include: Whole milk. Butter. Cream cheese. Eat small meals often. Avoid eating big meals. Avoid drinking lots of liquid with your meals. Try not to eat meals during the 2-3 hours before bedtime. Try not to lie down right after you eat. Do not exercise right after you eat. Lifestyle  If you're overweight, lose an amount of weight that's healthy for you. Ask your provider about a safe weight loss goal. Do not smoke, vape, or use nicotine or tobacco. Wear  loose clothes. Do not wear things that are tight around your waist. When you sleep, try: Raising the head of your bed about 6 inches (15 cm). You can use a wedge to do this. Lying down on your left side. Try to lower your stress. If you need help doing  this, ask your provider. General instructions Take your medicines only as told. Do not take aspirin or ibuprofen unless you're told to. Watch for any changes in your symptoms. Do not bend over if it makes your symptoms worse. Contact a health care provider if: You have new symptoms. You have trouble: Drinking. Swallowing. Eating. It hurts to swallow. You have wheezing. You have a cough that won't go away. Your voice is hoarse. Your symptoms don't get better with treatment. Get help right away if: You have pain all of a sudden in your: Arm. Neck. Jaw. Teeth. Back. You feel sweaty, dizzy, or light-headed all of a sudden. You faint. You have chest pain or shortness of breath. You vomit and the vomit is: Green, yellow, or black. Looks like blood or coffee grounds. Your poop is red, bloody, or black. These symptoms may be an emergency. Call 911 right away. Do not wait to see if the symptoms will go away. Do not drive yourself to the hospital. This information is not intended to replace advice given to you by your health care provider. Make sure you discuss any questions you have with your health care provider. Document Revised: 01/29/2023 Document Reviewed: 08/15/2022 Elsevier Patient Education  2024 [employer].

## 2023-06-04 NOTE — Progress Notes (Signed)
 Subjective:  Patient ID: Tina Arroyo, female    DOB: [birth year]  Age: 58 y.o. MRN: [id number]  CC: Hypertension and Gastroesophageal Reflux   HPI Tina Arroyo presents for f/up -----  Discussed the use of AI scribe software for clinical note transcription with the patient, who gave verbal consent to proceed.  History of Present Illness   Tina Arroyo is a 58 year old female who presents with concerns about heartburn and low blood pressure.  She experiences intermittent stomach pain and frequent burping, which she associates with heartburn. She recalls being prescribed Nexium in the past, which alleviated her symptoms, but she has not been taking any medication for this recently. No trouble swallowing, painful swallowing, or significant weight loss, although her weight fluctuates around 125 pounds.  She discusses her blood pressure management, noting that one of her blood pressure medications, which she took three times a day, was discontinued. She continues to take amlodipine and another medication. She mentions that her blood pressure is currently too low. No dizziness, lightheadedness, chest pain, or shortness of breath.  She no longer experiences headaches and feels she has a lot of energy now.       Outpatient Medications Prior to Visit  Medication Sig Dispense Refill   Cholecalciferol 50 MCG (2000 UT) TABS Take 1 tablet (2,000 Units total) by mouth daily. 90 tablet 1   rosuvastatin (CRESTOR) 10 MG tablet Take 1 tablet (10 mg total) by mouth daily. 90 tablet 0   spironolactone (ALDACTONE) 25 MG tablet Take 1 tablet (25 mg total) by mouth daily. 90 tablet 0   amLODipine (NORVASC) 5 MG tablet Take 1 tablet (5 mg total) by mouth daily. 90 tablet 0   Blood Pressure Monitoring (BLOOD PRESSURE CUFF) MISC 1 kit by Does not apply route daily. (Patient not taking: Reported on 06/04/2023) 1 each 0   esomeprazole (NEXIUM) 40 MG capsule Take 1 capsule (40 mg total) by mouth daily.  (Patient not taking: Reported on 06/04/2023) 90 capsule 1   No facility-administered medications prior to visit.    ROS Review of Systems  Constitutional:  Negative for chills, diaphoresis, fatigue and fever.  HENT: Negative.    Eyes: Negative.   Respiratory: Negative.  Negative for cough, chest tightness, shortness of breath and wheezing.   Cardiovascular:  Negative for chest pain, palpitations and leg swelling.  Gastrointestinal: Negative.  Negative for abdominal pain, constipation, diarrhea, nausea and vomiting.  Endocrine: Negative.   Genitourinary: Negative.  Negative for difficulty urinating, dysuria and hematuria.  Musculoskeletal: Negative.   Skin: Negative.   Neurological: Negative.  Negative for dizziness, weakness and light-headedness.  Hematological:  Negative for adenopathy. Does not bruise/bleed easily.  Psychiatric/Behavioral: Negative.      Objective:  BP 118/76 (BP Location: Left Arm, Patient Position: Sitting, Cuff Size: Normal)   Pulse 71   Temp 98.8 F (37.1 C) (Oral)   Resp 16   Ht 5' 3.25" (1.607 m)   Wt 122 lb 3.2 oz (55.4 kg)   SpO2 95%   BMI 21.48 kg/m   BP Readings from Last 3 Encounters:  06/04/23 118/76  05/24/23 108/78  05/17/23 (!) 93/58    Wt Readings from Last 3 Encounters:  06/04/23 122 lb 3.2 oz (55.4 kg)  04/25/23 124 lb 3.2 oz (56.3 kg)  07/31/21 124 lb (56.2 kg)    Physical Exam Vitals reviewed.  HENT:     Nose: Nose normal.     Mouth/Throat:  Mouth: Mucous membranes are moist.  Eyes:     General: No scleral icterus.    Conjunctiva/sclera: Conjunctivae normal.  Cardiovascular:     Rate and Rhythm: Normal rate and regular rhythm.     Heart sounds: No murmur heard.    No friction rub. No gallop.  Pulmonary:     Effort: Pulmonary effort is normal.     Breath sounds: No stridor. No wheezing, rhonchi or rales.  Abdominal:     General: Abdomen is flat.     Palpations: There is no mass.     Tenderness: There is no  abdominal tenderness. There is no guarding.     Hernia: No hernia is present.  Musculoskeletal:        General: Normal range of motion.     Cervical back: Neck supple.     Right lower leg: No edema.     Left lower leg: No edema.  Lymphadenopathy:     Cervical: No cervical adenopathy.  Skin:    General: Skin is warm and dry.  Neurological:     General: No focal deficit present.     Mental Status: She is alert.  Psychiatric:        Mood and Affect: Mood normal.        Behavior: Behavior normal.     Lab Results  Component Value Date   WBC 5.9 04/25/2023   HGB 12.3 04/25/2023   HCT 36.9 04/25/2023   PLT 244.0 04/25/2023   GLUCOSE 100 (H) 04/25/2023   CHOL 178 04/25/2023   TRIG 65.0 04/25/2023   HDL 53.60 04/25/2023   LDLCALC 112 (H) 04/25/2023   ALT 15 04/25/2023   AST 20 04/25/2023   NA 143 04/25/2023   K 3.8 04/25/2023   CL 106 04/25/2023   CREATININE 0.91 04/25/2023   BUN 13 04/25/2023   CO2 28 04/25/2023   TSH 0.99 04/25/2023   HGBA1C 5.3 12/30/2015    No results found.  Assessment & Plan:  Immunization due -     Pneumococcal polysaccharide vaccine 23-valent greater than or equal to 2yo subcutaneous/IM  Gastroesophageal reflux disease with esophagitis and hemorrhage -     Esomeprazole Magnesium; Take 1 capsule (40 mg total) by mouth daily.  Dispense: 90 capsule; Refill: 1  Need for prophylactic vaccination and inoculation against varicella -     Shingrix; Inject 0.5 mLs into the muscle once for 1 dose.  Dispense: 0.5 mL; Refill: 1  Primary hypertension- Her BP is over-controlled. Will discontinue the CCB.     Follow-up: Return in about 6 months (around 12/05/2023).  Sanda Linger, MD

## 2023-06-14 ENCOUNTER — Ambulatory Visit
Admission: RE | Admit: 2023-06-14 | Discharge: 2023-06-14 | Disposition: A | Discharge status: Home / Self Care | Payer: Medicaid Other | Source: Ambulatory Visit | Attending: Internal Medicine | Admitting: Internal Medicine

## 2023-06-14 ENCOUNTER — Other Ambulatory Visit: Payer: Medicaid Other

## 2023-06-14 DIAGNOSIS — R921 Mammographic calcification found on diagnostic imaging of breast: Secondary | ICD-10-CM | POA: Diagnosis not present

## 2023-06-14 DIAGNOSIS — Z09 Encounter for follow-up examination after completed treatment for conditions other than malignant neoplasm: Secondary | ICD-10-CM

## 2023-07-05 DIAGNOSIS — I10 Essential (primary) hypertension: Secondary | ICD-10-CM | POA: Diagnosis not present

## 2023-07-08 ENCOUNTER — Other Ambulatory Visit: Payer: Self-pay | Admitting: Nephrology

## 2023-07-08 DIAGNOSIS — I10 Essential (primary) hypertension: Secondary | ICD-10-CM

## 2023-07-24 ENCOUNTER — Encounter: Payer: Self-pay | Admitting: Podiatrist

## 2023-07-24 ENCOUNTER — Ambulatory Visit (INDEPENDENT_AMBULATORY_CARE_PROVIDER_SITE_OTHER): Admitting: Podiatrist

## 2023-07-24 DIAGNOSIS — D2371 Other benign neoplasm of skin of right lower limb, including hip: Secondary | ICD-10-CM

## 2023-07-24 NOTE — Progress Notes (Unsigned)
   Chief Complaint  Patient presents with   Foot Pain    Plantar forefoot right - callused area x months, also area between the 4th and 5th toes, shoes are uncomfortable at times, tries to keep shaved down   New Patient (Initial Visit)     HPI: Patient is 58 y.o. female who presents today for the concerns listed above.    Patient Active Problem List   Diagnosis Date Noted   Chronic constipation 06/04/2023   External hemorrhoids 06/04/2023   History of colonic polyps 06/04/2023   Immunization due 06/04/2023   Need for prophylactic vaccination and inoculation against varicella 06/04/2023   Low-renin essential hypertension 05/04/2023   Dyslipidemia, goal LDL below 100 04/26/2023   Gastroesophageal reflux disease with esophagitis and hemorrhage 12/22/2020   Screening mammogram for breast cancer 09/05/2020   HTN (hypertension) 02/23/2015    Current Outpatient Medications on File Prior to Visit  Medication Sig Dispense Refill   aMILoride (MIDAMOR) 5 MG tablet Take 5 mg by mouth daily.     Blood Pressure Monitoring (BLOOD PRESSURE CUFF) MISC 1 kit by Does not apply route daily. (Patient not taking: Reported on 06/04/2023) 1 each 0   Cholecalciferol  50 MCG (2000 UT) TABS Take 1 tablet (2,000 Units total) by mouth daily. 90 tablet 1   esomeprazole  (NEXIUM ) 40 MG capsule Take 1 capsule (40 mg total) by mouth daily. 90 capsule 1   rosuvastatin  (CRESTOR ) 10 MG tablet Take 1 tablet (10 mg total) by mouth daily. 90 tablet 0   spironolactone  (ALDACTONE ) 25 MG tablet Take 1 tablet (25 mg total) by mouth daily. 90 tablet 0   No current facility-administered medications on file prior to visit.    Allergies  Allergen Reactions   Tuberculin Tests     Review of Systems No fevers, chills, nausea, muscle aches, no difficulty breathing, no calf pain, no chest pain or shortness of breath.   Physical Exam  GENERAL APPEARANCE: Alert, conversant. Appropriately groomed. No acute distress.    VASCULAR: Pedal pulses palpable 2/4 DP and  PT bilateral.  Capillary refill time is immediate to all digits,  Proximal to distal cooling is warm to warm.  Digital perfusion adequate.   NEUROLOGIC: sensation is intact to 5.07 monofilament at 5/5 sites bilateral.  Light touch is intact bilateral, vibratory sensation intact bilateral  MUSCULOSKELETAL: acceptable muscle strength, tone and stability bilateral.  Mild digital contracture 5th toe right foot.    DERMATOLOGIC: skin is warm, supple, and dry. Hyperkeratotic lesion present submet 4 right foot.  Hyperkeratotic lesion present between toes 4/5 of the right foot.  Intact integument is present post debridement.   Assessment     ICD-10-CM   1. Benign neoplasm of skin of right foot  D23.71        Plan  Pared the lesions with a 15 blade without complication.  Applied salinocaine and a pad. Padding dispensed.  She will call in the future if debridement is needed.

## 2023-08-15 ENCOUNTER — Other Ambulatory Visit: Payer: Self-pay | Admitting: Nephrology

## 2023-08-15 DIAGNOSIS — I129 Hypertensive chronic kidney disease with stage 1 through stage 4 chronic kidney disease, or unspecified chronic kidney disease: Secondary | ICD-10-CM

## 2023-08-15 DIAGNOSIS — I10 Essential (primary) hypertension: Secondary | ICD-10-CM

## 2023-08-27 ENCOUNTER — Ambulatory Visit
Admission: RE | Admit: 2023-08-27 | Discharge: 2023-08-27 | Disposition: A | Discharge status: Home / Self Care | Source: Ambulatory Visit | Attending: Nephrology

## 2023-08-27 DIAGNOSIS — I129 Hypertensive chronic kidney disease with stage 1 through stage 4 chronic kidney disease, or unspecified chronic kidney disease: Secondary | ICD-10-CM

## 2023-10-14 DIAGNOSIS — I10 Essential (primary) hypertension: Secondary | ICD-10-CM | POA: Diagnosis not present

## 2023-10-14 DIAGNOSIS — K219 Gastro-esophageal reflux disease without esophagitis: Secondary | ICD-10-CM | POA: Diagnosis not present

## 2023-10-14 DIAGNOSIS — E559 Vitamin D deficiency, unspecified: Secondary | ICD-10-CM | POA: Diagnosis not present

## 2023-10-14 DIAGNOSIS — E785 Hyperlipidemia, unspecified: Secondary | ICD-10-CM | POA: Diagnosis not present

## 2023-12-11 ENCOUNTER — Ambulatory Visit: Admitting: Internal Medicine

## 2023-12-11 ENCOUNTER — Encounter: Payer: Self-pay | Admitting: Internal Medicine

## 2023-12-11 VITALS — BP 118/76 | HR 79 | Temp 98.6°F | Resp 16 | Ht 63.25 in | Wt 117.6 lb

## 2023-12-11 DIAGNOSIS — E785 Hyperlipidemia, unspecified: Secondary | ICD-10-CM | POA: Diagnosis not present

## 2023-12-11 DIAGNOSIS — Z23 Encounter for immunization: Secondary | ICD-10-CM | POA: Diagnosis not present

## 2023-12-11 DIAGNOSIS — K648 Other hemorrhoids: Secondary | ICD-10-CM | POA: Diagnosis not present

## 2023-12-11 DIAGNOSIS — I10 Essential (primary) hypertension: Secondary | ICD-10-CM

## 2023-12-11 DIAGNOSIS — E559 Vitamin D deficiency, unspecified: Secondary | ICD-10-CM

## 2023-12-11 LAB — CBC WITH DIFFERENTIAL/PLATELET
Basophils Absolute: 0 K/uL (ref 0.0–0.1)
Basophils Relative: 0.4 % (ref 0.0–3.0)
Eosinophils Absolute: 0.1 K/uL (ref 0.0–0.7)
Eosinophils Relative: 2.8 % (ref 0.0–5.0)
HCT: 36.9 % (ref 36.0–46.0)
Hemoglobin: 12.3 g/dL (ref 12.0–15.0)
Lymphocytes Relative: 44.7 % (ref 12.0–46.0)
Lymphs Abs: 2 K/uL (ref 0.7–4.0)
MCHC: 33.4 g/dL (ref 30.0–36.0)
MCV: 89.8 fl (ref 78.0–100.0)
Monocytes Absolute: 0.4 K/uL (ref 0.1–1.0)
Monocytes Relative: 8.6 % (ref 3.0–12.0)
Neutro Abs: 2 K/uL (ref 1.4–7.7)
Neutrophils Relative %: 43.5 % (ref 43.0–77.0)
Platelets: 199 K/uL (ref 150.0–400.0)
RBC: 4.11 Mil/uL (ref 3.87–5.11)
RDW: 14.1 % (ref 11.5–15.5)
WBC: 4.5 K/uL (ref 4.0–10.5)

## 2023-12-11 LAB — BASIC METABOLIC PANEL WITH GFR
BUN: 14 mg/dL (ref 6–23)
CO2: 29 meq/L (ref 19–32)
Calcium: 9.4 mg/dL (ref 8.4–10.5)
Chloride: 105 meq/L (ref 96–112)
Creatinine, Ser: 1 mg/dL (ref 0.40–1.20)
GFR: 62.49 mL/min (ref >60.00)
Glucose, Bld: 91 mg/dL (ref 70–99)
Potassium: 3.7 meq/L (ref 3.5–5.1)
Sodium: 141 meq/L (ref 135–145)

## 2023-12-11 MED ORDER — SHINGRIX 50 MCG/0.5ML IM SUSR: 0.5000 mL | Freq: Once | INTRAMUSCULAR | 1 refills | Status: AC

## 2023-12-11 MED ORDER — ROSUVASTATIN CALCIUM 10 MG PO TABS: 10.0000 mg | ORAL_TABLET | Freq: Every day | ORAL | 0 refills | Status: DC

## 2023-12-11 MED ORDER — CHOLECALCIFEROL 50 MCG (2000 UT) PO TABS: 1.0000 | ORAL_TABLET | Freq: Every day | ORAL | 1 refills | Status: AC

## 2023-12-11 NOTE — Patient Instructions (Signed)
 Hemorrhoids Hemorrhoids are swollen veins in and around the rectum or the opening of the butt (anus). There are two types of hemorrhoids: Internal. These occur in the veins just inside the rectum. They may poke through to the outside and become irritated and painful. External. These occur in the veins outside the anus. They can be felt as a painful swelling or hard lump near the anus. Most hemorrhoids do not cause severe problems. Often, they can be treated at home with diet and lifestyle changes. If home treatments do not help, you may need a procedure to shrink or remove the hemorrhoids. What are the causes? Hemorrhoids are caused by pressure near the anus. This pressure may be caused by: Constipation or diarrhea. Straining to poop. Pregnancy. Obesity. Sitting or riding a bike for a long time. Heavy lifting or other things that cause you to strain. Anal sex. What are the signs or symptoms? Symptoms of this condition include: Pain. Anal itching or irritation. Bleeding from the rectum. Leakage of poop (stool). Swelling of the anus. One or more lumps around the anus. How is this diagnosed? Hemorrhoids can often be diagnosed through a visual exam. Other exams or tests may also be done, such as: A digital rectal exam. This is when your health care provider feels inside your rectum with a gloved finger. Anoscope. This is an exam of the anus using a small tube. A blood test, if you have lost a lot of blood. A sigmoidoscopy or colonoscopy. These are tests to look inside the colon using a tube with a camera on the end. How is this treated? In most cases, hemorrhoids can be treated at home with diet and lifestyle changes. If these changes do not help, you may need to have a procedure done. These procedures can make the hemorrhoids smaller or fully remove them. Common procedures include: Rubber band ligation. Rubber bands are placed at the base of the hemorrhoids to cut off their blood  supply. Sclerotherapy. Medicine is put into the hemorrhoids to shrink them. Infrared coagulation. A type of light energy is used to get rid of the hemorrhoids. Hemorrhoidectomy surgery. The hemorrhoids are removed during surgery. Then, the veins that supply them are tied off. Stapled hemorrhoidopexy surgery. The base of the hemorrhoid is stapled to the wall of the rectum. Follow these instructions at home: Medicines Take over-the-counter and prescription medicines only as told by your provider. Use medicated creams or medicines that are put in the rectum (suppositories) as told by your provider. Eating and drinking  Eat foods that are high in fiber, such as beans, whole grains, and fresh fruits and vegetables. Ask your provider about taking products that have fiber added to them (fiber supplements). Reduce the amount of fat in your diet. You can do this by eating low-fat dairy products, eating less red meat, and avoiding processed foods. Drink enough fluid to keep your pee (urine) pale yellow. Managing pain and swelling  Take warm sitz baths for 20 minutes, 3-4 times a day. This can help ease pain and discomfort. You may do this in a bathtub or you can use a portable sitz bath that fits over the toilet. If told, put ice on the affected area. It may help to use ice packs between sitz baths. Put ice in a plastic bag. Place a towel between your skin and the bag. Leave the ice on for 20 minutes, 2-3 times a day. If your skin turns bright red, remove the ice right away to prevent  skin damage. The risk of damage is higher if you cannot feel pain, heat, or cold. General instructions Exercise. Ask your provider how much and what kind of exercise is best for you. In general, you should do moderate exercise for at least 30 minutes on most days of the week (150 minutes each week). You may want to try walking, biking, or yoga. Go to the bathroom when you have the urge to poop. Do not wait. Avoid  straining to poop. Keep the anus dry and clean. Use wet toilet paper or moist towelettes after you poop. Do not sit on the toilet for a long time. This can increase blood pooling and pain. Where to find more information [employer] and Digestive [employer] Kidney Diseases: [employer] Contact a health care provider if: You have more pain and swelling that do not get better with treatment. You have trouble pooping or you are not able to poop. You have pain or inflammation outside the area of the hemorrhoids. Get help right away if: You are bleeding from your rectum and you cannot get it to stop. This information is not intended to replace advice given to you by your health care provider. Make sure you discuss any questions you have with your health care provider. Document Revised: 11/29/2021 Document Reviewed: 11/29/2021 Elsevier Patient Education  2024 [employer].

## 2023-12-11 NOTE — Progress Notes (Signed)
 Subjective:  Patient ID: Tina Arroyo, female    DOB: September 09, 1965  Age: 58 y.o. MRN: [id number]  CC: Medical Management of Chronic Issues (6 month follow up. No major concerns. ) and Hemorrhoids (Patient states that when she has a bowel movement she has been passing blood and when she wipes she sees blood also. Her brother passed away from having cancer and she wants to make sure that's not what she's going through. )   HPI Tina Arroyo presents for f/up ----  Discussed the use of AI scribe software for clinical note transcription with the patient, who gave verbal consent to proceed.  History of Present Illness Tina Arroyo is a 58 year old female who presents with rectal bleeding associated with hemorrhoids.  She reports rectal bleeding with almost every bowel movement for about a month and a history of internal hemorrhoids flaring up. There is no associated pain during bowel movements.  No dizziness, lightheadedness, chest pain, shortness of breath, constipation, or diarrhea.  Her current medication is effective, and she feels great overall.  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 She does not smoke or drink alcohol.     Outpatient Medications Prior to Visit  Medication Sig Dispense Refill  . aMILoride (MIDAMOR) 5 MG tablet Take 5 mg by mouth daily.    . esomeprazole  (NEXIUM ) 40 MG capsule Take 1 capsule (40 mg total) by mouth daily. 90 capsule 1  . spironolactone  (ALDACTONE ) 25 MG tablet Take 1 tablet (25 mg total) by mouth daily. 90 tablet 0  . Cholecalciferol  50 MCG (2000 UT) TABS Take 1 tablet (2,000 Units total) by mouth daily. 90 tablet 1  . rosuvastatin  (CRESTOR ) 10 MG tablet Take 1 tablet (10 mg total) by mouth daily. 90 tablet 0  . Blood Pressure Monitoring (BLOOD PRESSURE CUFF) MISC 1 kit by Does not apply route daily. (Patient not taking: Reported on 06/04/2023) 1 each 0    No facility-administered medications prior to visit.    ROS Review of Systems  Constitutional:  Negative for appetite change, chills, diaphoresis, fatigue and fever.  HENT: Negative.  Negative for trouble swallowing.   Eyes: Negative.   Respiratory: Negative.  Negative for cough, chest tightness, shortness of breath and wheezing.   Cardiovascular:  Negative for chest pain, palpitations and leg swelling.  Gastrointestinal:  Positive for anal bleeding and blood in stool. Negative for abdominal pain, constipation, diarrhea, nausea and rectal pain.  Endocrine: Negative.   Genitourinary: Negative.  Negative for difficulty urinating.  Musculoskeletal: Negative.  Negative for arthralgias and myalgias.  Skin: Negative.   Neurological: Negative.  Negative for dizziness, weakness and headaches.  Hematological:  Negative for adenopathy. Does not bruise/bleed easily.  Psychiatric/Behavioral: Negative.      Objective:  BP 118/76 (BP Location: Left Arm, Patient Position: Sitting, Cuff Size: Normal)   Pulse 79   Temp 98.6 F (37 C) (Oral)   Resp 16   Ht 5' 3.25 (1.607 m)   Wt 117 lb 9.6 oz (53.3 kg)   SpO2 98%   BMI 20.67 kg/m   BP Readings from Last 3 Encounters:  12/11/23 118/76  06/04/23 118/76  05/24/23 108/78    Wt Readings from Last 3 Encounters:  12/11/23 117 lb 9.6 oz (53.3 kg)  06/04/23 122 lb 3.2 oz (55.4 kg)  04/25/23 124 lb 3.2 oz (56.3 kg)    Physical Exam Vitals reviewed.  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  HENT:     Mouth/Throat:     Mouth: Mucous membranes are moist.  Eyes:     General: No scleral icterus.    Conjunctiva/sclera: Conjunctivae normal.  Cardiovascular:     Rate and Rhythm: Normal rate.     Heart sounds: No murmur heard.    No friction rub. No gallop.  Pulmonary:     Effort: Pulmonary effort is normal.     Breath sounds: No stridor. No wheezing, rhonchi or rales.  Abdominal:     General: Abdomen is flat.     Palpations:  There is no mass.     Tenderness: There is no abdominal tenderness. There is no guarding.     Hernia: No hernia is present.  Genitourinary:    Rectum: Guaiac result negative. External hemorrhoid and internal hemorrhoid present. No mass, tenderness or anal fissure. Normal anal tone.     Comments: Uncomplicated I/E anal hemorrhoids Musculoskeletal:        General: Normal range of motion.     Cervical back: Neck supple.     Right lower leg: No edema.     Left lower leg: No edema.  Lymphadenopathy:     Cervical: No cervical adenopathy.  Skin:    General: Skin is warm and dry.  Neurological:     General: No focal deficit present.     Mental Status: She is alert. Mental status is at baseline.  Psychiatric:        Mood and Affect: Mood normal.     Lab Results  Component Value Date   WBC 4.5 12/11/2023   HGB 12.3 12/11/2023   HCT 36.9 12/11/2023   PLT 199.0 12/11/2023   GLUCOSE 91 12/11/2023   CHOL 178 04/25/2023   TRIG 65.0 04/25/2023   HDL 53.60 04/25/2023   LDLCALC 112 (H) 04/25/2023   ALT 15 04/25/2023   AST 20 04/25/2023   NA 141 12/11/2023   K 3.7 12/11/2023   CL 105 12/11/2023   CREATININE 1.00 12/11/2023   BUN 14 12/11/2023   CO2 29 12/11/2023   TSH 0.99 04/25/2023   HGBA1C 5.3 12/30/2015    US  RENAL ARTERY DUPLEX COMPLETE Result Date: 08/29/2023 CLINICAL DATA:  hypertension benign EXAM: RENAL/URINARY TRACT ULTRASOUND RENAL DUPLEX DOPPLER ULTRASOUND COMPARISON:  CT abdomen and pelvis 03/30/2021. FINDINGS: Right Kidney: Length: 10.6 cm. Echogenicity within normal limits. No mass or hydronephrosis visualized. Left Kidney: Length: 10.7 cm. Echogenicity within normal limits. No mass or hydronephrosis visualized. Bladder:  Unremarkable. RENAL DUPLEX ULTRASOUND Right Renal Artery Velocities: Origin:  59 cm/sec Mid:  97 cm/sec Hilum:  71 cm/sec Interlobar:  44 cm/sec Arcuate:  40 cm/sec Left Renal Artery Velocities: Origin:  81 cm/sec Mid:  139 cm/sec Hilum:  96 cm/sec  Interlobar:  57 cm/sec Arcuate:  41 cm/sec Aortic Velocity:  53 cm/sec Right Renal-Aortic Ratios: Origin: 1.1 Mid:  1.8 Hilum: 1.3 Interlobar: 0.8 Arcuate: 0.3 Left Renal-Aortic Ratios: Origin: 1.5 Mid: 2.6 Hilum: 1.8 Interlobar: 1.1 Arcuate: 0.8 IMPRESSION: No Doppler evidence of renal artery stenosis. Electronically Signed   By: Gwynneth Akin M.D.   On: 08/29/2023 11:48    Assessment & Plan:  Need for immunization against influenza -     Flu vaccine trivalent PF, 6mos and older(Flulaval,Afluria,Fluarix,Fluzone)  Primary hypertension- BP is over-controlled. Will discontinue the MRA. -     Basic metabolic panel with GFR; Future -     CBC with Differential/Platelet; Future  Internal hemorrhoid -  Ambulatory referral to Gastroenterology  Dyslipidemia, goal LDL below 100 -     Rosuvastatin  Calcium ; Take 1 tablet (10 mg total) by mouth daily.  Dispense: 90 tablet; Refill: 0  Vitamin D  deficiency disease -     Cholecalciferol ; Take 1 tablet (2,000 Units total) by mouth daily.  Dispense: 90 tablet; Refill: 1  Need for prophylactic vaccination and inoculation against varicella -     Shingrix ; Inject 0.5 mLs into the muscle once for 1 dose.  Dispense: 0.5 mL; Refill: 1     Follow-up: Return in about 3 months (around 03/11/2024).  Debby Molt, MD

## 2023-12-12 ENCOUNTER — Ambulatory Visit: Payer: Self-pay | Admitting: Internal Medicine

## 2024-03-04 ENCOUNTER — Ambulatory Visit: Payer: Self-pay

## 2024-03-04 NOTE — Telephone Encounter (Signed)
 FYI Only or Action Required?: FYI only for provider: Referred to call GI.  Patient was last seen in primary care on 12/11/2023 by Joshua Debby CROME, MD.  Called Nurse Triage reporting Rectal Bleeding.  Symptoms began several months ago.  Interventions attempted: Nothing.  Symptoms are: unchanged.  Triage Disposition: See PCP Within 2 Weeks  Patient/caregiver understands and will follow disposition?: Yes Reason for Disposition  Rectal bleeding is a chronic symptom (recurrent or ongoing AND present > 4 weeks)  Answer Assessment - Initial Assessment Questions Patient was evaluated by PCP for this and has hemorrhoids. Was referred to GI back in September and has not been called to schedule. Gave patient the information for [facility] office that she was referred to so she can call schedule.   1. APPEARANCE of BLOOD: What color is it? Is it passed separately, on the surface of the stool, or mixed in with the stool?      Mixed in  2. AMOUNT: How much blood was passed?      Turning toilet water red  3. FREQUENCY: How many times has blood been passed with the stools?      Almost every time  4. ONSET: When was the blood first seen in the stools? (Days or weeks)      A while now  5. DIARRHEA: Is there also some diarrhea? If Yes, ask: How many diarrhea stools in the past 24 hours?      Denies  6. CONSTIPATION: Do you have constipation? If Yes, ask: How bad is it?     Denies  7. RECURRENT SYMPTOMS: Have you had blood in your stools before? If Yes, ask: When was the last time? and What happened that time?      Yes  8. BLOOD THINNERS: Do you take any blood thinners? (e.g., aspirin, clopidogrel / Plavix, coumadin, heparin). Notes: Other strong blood thinners include: Arixtra (fondaparinux), Eliquis (apixaban), Pradaxa (dabigatran), and Xarelto (rivaroxaban).     Denies  9. OTHER SYMPTOMS: Do you have any other symptoms?  (e.g., abdomen pain, vomiting,  dizziness, fever)     Abdominal pain has also been going on for awhile.  Protocols used: Rectal Bleeding-A-AH  Copied from CRM [id number]. Topic: Clinical - Red Word Triage >> Mar 04, 2024  1:48 PM [name] wrote: Red Word that prompted transfer to Nurse Triage: Bloody stool and stomach pain.

## 2024-03-11 ENCOUNTER — Encounter: Payer: Self-pay | Admitting: Nurse Practitioner

## 2024-03-11 ENCOUNTER — Other Ambulatory Visit (INDEPENDENT_AMBULATORY_CARE_PROVIDER_SITE_OTHER)

## 2024-03-11 ENCOUNTER — Ambulatory Visit (INDEPENDENT_AMBULATORY_CARE_PROVIDER_SITE_OTHER): Admitting: Nurse Practitioner

## 2024-03-11 VITALS — BP 100/60 | HR 80 | Ht 65.0 in | Wt 121.1 lb

## 2024-03-11 DIAGNOSIS — K625 Hemorrhage of anus and rectum: Secondary | ICD-10-CM | POA: Diagnosis not present

## 2024-03-11 DIAGNOSIS — Z860101 Personal history of adenomatous and serrated colon polyps: Secondary | ICD-10-CM

## 2024-03-11 DIAGNOSIS — Z860100 Personal history of colon polyps, unspecified: Secondary | ICD-10-CM

## 2024-03-11 DIAGNOSIS — R1013 Epigastric pain: Secondary | ICD-10-CM | POA: Diagnosis not present

## 2024-03-11 DIAGNOSIS — K6289 Other specified diseases of anus and rectum: Secondary | ICD-10-CM

## 2024-03-11 DIAGNOSIS — K648 Other hemorrhoids: Secondary | ICD-10-CM

## 2024-03-11 LAB — CBC
HCT: 33.8 % — ABNORMAL LOW (ref 36.0–46.0)
Hemoglobin: 11.8 g/dL — ABNORMAL LOW (ref 12.0–15.0)
MCHC: 34.9 g/dL (ref 30.0–36.0)
MCV: 88.9 fl (ref 78.0–100.0)
Platelets: 232 K/uL (ref 150.0–400.0)
RBC: 3.8 Mil/uL — ABNORMAL LOW (ref 3.87–5.11)
RDW: 14.8 % (ref 11.5–15.5)
WBC: 5.6 K/uL (ref 4.0–10.5)

## 2024-03-11 MED ORDER — HYDROCORTISONE ACETATE 25 MG RE SUPP: 25.0000 mg | Freq: Every day | RECTAL | 0 refills | Status: AC

## 2024-03-11 MED ORDER — NA SULFATE-K SULFATE-MG SULF 17.5-3.13-1.6 GM/177ML PO SOLN: 1.0000 | Freq: Once | ORAL | 0 refills | Status: AC

## 2024-03-11 MED ORDER — ESOMEPRAZOLE MAGNESIUM 40 MG PO CPDR: 40.0000 mg | DELAYED_RELEASE_CAPSULE | Freq: Two times a day (BID) | ORAL | 1 refills | Status: DC

## 2024-03-11 NOTE — Progress Notes (Unsigned)
 03/11/2024 Tina Arroyo [id number] 1965/04/12   CHIEF COMPLAINT: Hemorrhoids, rectal bleeding   HISTORY OF PRESENT ILLNESS: Tina Arroyo is a 58 year old female with a past medical history of hypertension, migraine headaches, UGI bleed/melena and EGD showed minimal antral gastritis in 2016 and colon polyps. She is known by Dr. Albertus.   She presents to our office today as referred by Dr. Debby Molt for further evaluation regarding hemorrhoids with rectal bleeding. Patient also endorses having epigastric pain since 12/2023 which has progressively worsened and is sometimes worse prior to eating. She often feels nauseous and belches a lot. She is passing a soft brown stool once daily and sees bright red blood per the rectum 4 to 5 days weekly. She passed black stools x 2 episodes a few months ago. No Pepto bismol or oral iron use. She has intermittent hemorrhoidal pain and swelling. She uses Preparation H as needed. She has occasional constipation.  She takes Ibuprofen  3 tabs every other day for dental pain and left knee pain for the past year. No GERD symptoms on Nexium  40mg  every day.  She underwent an EGD 07/18/2016 by Dr. Albertus which showed evidence of Monilial esophagitis, treated with Fluconazole  and gastritis without evidence of H. Pylori. A colonoscopy was done on the same date which showed three polyps removed from the colon, path report consistent with tubular adenomas and a hyperplastic polyp.   Her most recent colonoscopy was done by [facility] 02/15/2021, 2 tubular adenomatous polyps were removed from the transverse colon and one polyp was removed from the sigmoid colon. The bowel prep was documented as unsatisfactory.      Latest Ref Rng & Units 12/11/2023    4:05 PM 04/25/2023    4:43 PM 01/03/2021    9:54 AM  CBC  WBC 4.0 - 10.5 K/uL 4.5  5.9  4.5   Hemoglobin 12.0 - 15.0 g/dL 87.6  87.6  87.6   Hematocrit 36.0 - 46.0 % 36.9  36.9  36.7   Platelets 150.0 - 400.0 K/uL  199.0  244.0  198.0        Latest Ref Rng & Units 12/11/2023    4:05 PM 04/25/2023    4:43 PM 12/22/2020    2:04 PM  CMP  Glucose 70 - 99 mg/dL 91  899  89   BUN 6 - 23 mg/dL 14  13  19    Creatinine 0.40 - 1.20 mg/dL 8.99  9.08  9.04   Sodium 135 - 145 mEq/L 141  143  142   Potassium 3.5 - 5.1 mEq/L 3.7  3.8  4.2   Chloride 96 - 112 mEq/L 105  106  106   CO2 19 - 32 mEq/L 29  28  32   Calcium  8.4 - 10.5 mg/dL 9.4  9.0  9.2   Total Protein 6.0 - 8.3 g/dL  7.3    Total Bilirubin 0.2 - 1.2 mg/dL  0.4    Alkaline Phos 39 - 117 U/L  68    AST 0 - 37 U/L  20    ALT 0 - 35 U/L  15     GI PROCEDURES:  Colonoscopy 02/15/2021:  2 tubular adenomatous polyps were removed from the transverse colon and one polyp was removed from the sigmoid colon. The bowel prep was documented as unsatisfactory.   EGD 07/18/2016 by Dr. Albertus: - Monilial esophagitis.  - Gastritis. Biopsied.  - Normal examined duodenum.  - Treated with Fluconazole   Colonoscopy 07/18/2016: - One 5 mm polyp at the hepatic flexure, removed with a cold snare. Resected and retrieved.  - One 5 mm polyp in the transverse colon, removed with a cold snare. Resected and retrieved.  - One 4 mm polyp in the sigmoid colon, removed with a cold snare. Resected and retrieved.  - Internal hemorrhoids. - 5 year recall colonoscopy   1. Surgical [P], gastric antrum and gastric body - MILDLY ACTIVE CHRONIC GASTRITIS. - WARTHIN-STARRY STAINING NEGATIVE FOR HELICOBACTER  PYLORI.  2. Surgical [P], hepatic flexure, transverse, polyp (2) - TUBULAR ADENOMA (1). NO HIGH GRADE DYSPLASIA OR MALIGNANCY IDENTIFIED. - BENIGN POLYPOID COLONIC EPITHELIUM (1). NO DYSPLASIA OR MALIGNANCY IDENTIFIED.  3. Surgical [P], sigmoid, polyp - HYPERPLASTIC POLYP (1). NO DYSPLASIA OR MALIGNANCY IDENTIFIED. Mar  EGD 02/24/2014: Minimal antral gastritis o/w normal EGD   Past Medical History:  Diagnosis Date   Anemia    Bleeding stomach ulcer 2016   Candida  esophagitis (HCC)    Hypertension    Internal hemorrhoids    Migraines    Past Surgical History:  Procedure Laterality Date   ABDOMINAL HYSTERECTOMY     ESOPHAGOGASTRODUODENOSCOPY N/A 02/25/2015   Procedure: ESOPHAGOGASTRODUODENOSCOPY (EGD);  Surgeon: Jerrell Sol, MD;  Location: [facility];  Service: Endoscopy;  Laterality: N/A;   FOOT SURGERY     knee     left ligament got tore up surgery   MULTIPLE TOOTH EXTRACTIONS     TUBAL LIGATION     Social History:  She is married. She has one son and one daughter. No tobacco use. She occasinally smokes marijuana. No alcohol use.   Family History: family history includes Breast cancer in her maternal aunt and maternal grandmother; Diabetes in her maternal grandfather and mother; Heart disease in her maternal grandmother; Hypertension in her mother; Prostate cancer in her brother; Throat cancer in her brother.  Allergies  Allergen Reactions   Tuberculin Tests      Outpatient Encounter Medications as of 03/11/2024  Medication Sig   aMILoride (MIDAMOR) 5 MG tablet Take 5 mg by mouth daily.   Cholecalciferol  50 MCG (2000 UT) TABS Take 1 tablet (2,000 Units total) by mouth daily.   esomeprazole  (NEXIUM ) 40 MG capsule Take 1 capsule (40 mg total) by mouth daily.   rosuvastatin  (CRESTOR ) 10 MG tablet Take 1 tablet (10 mg total) by mouth daily.   No facility-administered encounter medications on file as of 03/11/2024.   REVIEW OF SYSTEMS:  Gen: Denies fever, sweats or chills. No weight loss.  CV: Denies chest pain, palpitations or edema. Resp: Denies cough, shortness of breath of hemoptysis.  GI: See HPI. GU: Denies urinary burning, blood in urine, increased urinary frequency or incontinence. MS: Denies joint pain, muscles aches or weakness. Derm: Denies rash, itchiness, skin lesions or unhealing ulcers. Psych: Denies depression, anxiety, memory loss or confusion. Heme: Denies bruising, easy bleeding. Neuro:  Denies headaches,  dizziness or paresthesias. Endo:  Denies any problems with DM, thyroid  or adrenal function.  PHYSICAL EXAM: Ht 5' 5 (1.651 m)   Wt 121 lb 2 oz (54.9 kg)   BMI 20.16 kg/m  Wt Readings from Last 3 Encounters:  03/11/24 121 lb 2 oz (54.9 kg)  12/11/23 117 lb 9.6 oz (53.3 kg)  06/04/23 122 lb 3.2 oz (55.4 kg)    General:  59 year old female in no acute distress. Head: Normocephalic and atraumatic. Eyes:  Sclerae non-icteric, conjunctive pink. Ears: Normal auditory acuity. Mouth: Dentition intact. No ulcers or lesions.  Neck: Supple, no lymphadenopathy. + thyromegaly, right lobe > left  Lungs: Clear bilaterally to auscultation without wheezes, crackles or rhonchi. Heart: Regular rate and rhythm. No murmur, rub or gallop appreciated.  Abdomen: Soft, nontender, nondistended. No masses. No hepatosplenomegaly. Normoactive bowel sounds x 4 quadrants.  Rectal: Soft round area of swelling, size of dime, to the external left lateral anoderm which is nontender and is not palpable inside the anorectum. Possible anorectal abscess vs sebaceous cyst. No surrounding erythema. Internal hemorrhoids without prolapse.  Musculoskeletal: Symmetrical with no gross deformities. Skin: Warm and dry. No rash or lesions on visible extremities. Extremities: No edema. Neurological: Alert oriented x 4, no focal deficits.  Psychological: Alert and cooperative. Normal mood and affect.  ASSESSMENT AND PLAN:  58 year old female with epigastric pain and black stools x 2 episodes. Prior history of GI bleed/melena in 07/2014, EGD showed minimal gastritis.  - Increase Esomeprazole  to 40mg  bid to be taken 30 minutes before breakfast and dinner  - EGD benefits and risks discussed including risk with sedation, risk of bleeding, perforation and infection  - GERD diet   Rectal bleeding, internal hemorrhoids  - Anusol HC suppository one PR Q HS x 5 nights  - Colonoscopy benefits and risks discussed including risk with  sedation, risk of bleeding, perforation and infection  - Patient to take Miralax one capful mixed in 8 ounces of water at bed time x 7 nights prior to colonoscopy prep date  - Benefiber one tablespoon daily as tolerated  - CBC  Small external left lateral anal swelling, possible small anal abscess vs sebaceous cyst, nontender.  - Patient instructed to soak in warm water/sitz bath for 10 minutes bid x 7 days - To consider a course of Augmentin 875mg  bid x 10 days if no improvement and refer to colorectal surgery to drain - Patient to call me in one week with an update, sooner if she develops pain or if the anal swelling worsens  - Colonoscopy as ordered above   History of colon polyps. She underwent a colonoscopy by [facility] 02/15/2021, 2 tubular adenomatous polyps were removed from the transverse colon and one polyp was removed from the sigmoid colon. The bowel prep was documented as unsatisfactory.  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 Debby Molt, recommend thyroid  ultrasound        CC:  Molt Debby CROME, MD

## 2024-03-11 NOTE — Patient Instructions (Addendum)
 _______________________________________________________  If your blood pressure at your visit was 140/90 or greater, please contact your primary care physician to follow up on this.  _______________________________________________________  If you are age 57 or older, your body mass index should be between 23-30. Your Body mass index is 20.16 kg/m. If this is out of the aforementioned range listed, please consider follow up with your Primary Care Provider.  If you are age 57 or younger, your body mass index should be between 19-25. Your Body mass index is 20.16 kg/m. If this is out of the aformentioned range listed, please consider follow up with your Primary Care Provider.   ________________________________________________________  The [facility] providers would like to encourage you to use MYCHART to communicate with providers for non-urgent requests or questions.  Due to long hold times on the telephone, sending your provider a message by [facility] may be a faster and more efficient way to get a response.  Please allow 48 business hours for a response.  Please remember that this is for non-urgent requests.  _______________________________________________________  [facility] is using a team-based approach to care.  Your team is made up of your doctor and two to three APPS. Our APPS (Nurse Practitioners and Physician Assistants) work with your physician to ensure care continuity for you. They are fully qualified to address your health concerns and develop a treatment plan. They communicate directly with your gastroenterologist to care for you. Seeing the Advanced Practice Practitioners on your physician's team can help you by facilitating care more promptly, often allowing for earlier appointments, access to diagnostic testing, procedures, and other specialty referrals.   Your provider has requested that you go to the basement level for lab work before leaving today. Press B on the  elevator. The lab is located at the first door on the left as you exit the elevator.  We have sent the following medications to your pharmacy for you to pick up at your convenience: Nexium  2 times a day take 30 minutes before breakfast and dinner Anusol supp apply nightly in rectum area for 5 nights Suprep  Please purchase the following medications over the counter and take as directed: Benefiber 1 tablespoon daily on 8oz water Miralax 17g as needed at night time  Sitz bath for 10 minutes 2 times a day  Please call the nurse in 1 week at [phone] with an update on how you are doing.  You have been scheduled for an endoscopy and colonoscopy. Please follow the written instructions given to you at your visit today.  If you use inhalers (even only as needed), please bring them with you on the day of your procedure.  DO NOT TAKE 7 DAYS PRIOR TO TEST- Trulicity (dulaglutide) Ozempic, Wegovy (semaglutide) Mounjaro, Zepbound (tirzepatide) Bydureon Bcise (exanatide extended release)  DO NOT TAKE 1 DAY PRIOR TO YOUR TEST Rybelsus (semaglutide) Adlyxin (lixisenatide) Victoza (liraglutide) Byetta (exanatide) ___________________________________________________________________________  How to Take a Sitz Bath A sitz bath is a warm water bath that may be used to care for your rectum, genital area, or the area between your rectum and genitals (perineum). In a sitz bath, the water only comes up to your hips and covers your buttocks. A sitz bath may be done in a bathtub or with a portable sitz bath that fits over the toilet. Your health care provider may recommend a sitz bath to help: Relieve pain and discomfort after delivering a baby. Relieve pain and itching from hemorrhoids or anal fissures. Relieve pain after certain  surgeries. Relax muscles that are sore or tight. How to take a sitz bath Take 2-4 sitz baths a day, or as many as told by your health care provider. Bathtub sitz  bath To take a sitz bath in a bathtub: Partially fill a bathtub with warm water. The water should be deep enough to cover your hips and buttocks when you are sitting in the bathtub. Follow your health care provider's instructions if you are told to put medicine in the water. Sit in the water. Open the bathtub drain a little, and leave it open during your bath. Turn on the warm water again, enough to replace the water that is draining out. Keep the water running throughout your bath. This helps keep the water at the right level and temperature. Soak in the water for 15-20 minutes, or as long as told by your health care provider. When you are done, be careful when you stand up. You may feel dizzy. After the sitz bath, pat yourself dry. Do not rub your skin to dry it.  Over-the-toilet sitz bath To take a sitz bath with an over-the-toilet basin: Follow the manufacturer's instructions. Fill the basin with warm water. Follow your health care provider's instructions if you were told to put medicine in the water. Sit on the seat. Make sure the water covers your buttocks and perineum. Soak in the water for 15-20 minutes, or as long as told by your health care provider. After the sitz bath, pat yourself dry. Do not rub your skin to dry it. Clean and dry the basin between uses. Discard the basin if it cracks, or according to the manufacturer's instructions.  Contact a health care provider if: Your pain or itching gets worse. Stop doing sitz baths if your symptoms get worse. You have new symptoms. Stop doing sitz baths until you talk with your health care provider. Summary A sitz bath is a warm water bath in which the water only comes up to your hips and covers your buttocks. Your health care provider may recommend a sitz bath to help relieve pain and discomfort after delivering a baby, relieve pain and itching from hemorrhoids or anal fissures, relieve pain after certain surgeries, or help to relax  muscles that are sore or tight. Take 2-4 sitz baths a day, or as many as told by your health care provider. Soak in the water for 15-20 minutes. Stop doing sitz baths if your symptoms get worse. This information is not intended to replace advice given to you by your health care provider. Make sure you discuss any questions you have with your health care provider. Document Revised: 06/20/2021 Document Reviewed: 06/20/2021 Elsevier Patient Education  2024 Elsevier Inc.   Thank you for trusting me with your gastrointestinal care!   Elida Shawl, CRNP

## 2024-03-12 ENCOUNTER — Other Ambulatory Visit (HOSPITAL_COMMUNITY): Payer: Self-pay

## 2024-03-12 ENCOUNTER — Ambulatory Visit: Payer: Self-pay | Admitting: Nurse Practitioner

## 2024-03-12 ENCOUNTER — Telehealth: Payer: Self-pay

## 2024-03-12 NOTE — Telephone Encounter (Signed)
 Pharmacy Patient Advocate Encounter   Received notification from CoverMyMeds that prior authorization for Esomeprazole  Magnesium  40MG  dr capsules is required/requested.   Insurance verification completed.   The patient is insured through [employer].   Per test claim: PA required; PA submitted to above mentioned insurance via Latent Key/confirmation #/EOC [id number] Status is pending

## 2024-03-13 NOTE — Progress Notes (Signed)
 Detailed message left on pts voicemail regarding recommendations per Elida Shawl NP prior to her colon.

## 2024-03-16 NOTE — Telephone Encounter (Signed)
 Pharmacy Patient Advocate Encounter  Received notification from [employer] that Prior Authorization for Esomeprazole  Magnesium  40MG  dr capsules has been DENIED.  Full denial letter will be uploaded to the media tab. See denial reason below.  The limit is 30 capsules per day(s). See below for a complete list of all approval criteria required per guideline.   One of the following (a or b):  A. Requested dose is supported by practice guidelines or peer-reviewed literature (e.g., phase 3 study or equivalent published in a reputable peer reviewed medical journal or text) for the relevant off-label use and/or regimen (prescriber must submit supporting evidence); B. Diagnosis of a rare condition/disease* for which Food and Drug Administration (FDA) dosing guidelines indicates a higher quantity (dose or frequency) than the currently set quantity limit (QL); *Example: Proton pump inhibitors, which are commonly used for gastroesophageal reflux disease, have a QL of one dose per day; however, when there is a rare diagnosis such as Zollinger-Ellison syndrome, an override for two doses per day is allowed.  2.   Member has been titrated up from the lower dose with partial improvement without adverse reactions.  3.   Dose optimization is required, unless one of the following applies (a or b): A. Dose titration: Multiple lower strength units are requested for the purpose of dose titration;  B. Other clinical reasons: Medical justification supports inability to use the higher strength units to achieve the desired dose/regimen  PA #/Case ID/Reference #: [id number]

## 2024-03-17 MED ORDER — ESOMEPRAZOLE MAGNESIUM 40 MG PO CPDR: 40.0000 mg | DELAYED_RELEASE_CAPSULE | Freq: Every day | ORAL | 1 refills | Status: AC

## 2024-03-17 MED ORDER — FAMOTIDINE 20 MG PO TABS: 20.0000 mg | ORAL_TABLET | Freq: Every day | ORAL | 1 refills | Status: AC

## 2024-03-17 NOTE — Telephone Encounter (Signed)
 Left message advising patient that insurance will only cover once daily esomeprazole , therefore she should take esomeprazole  every morning and famotidine , a new prescription at bedtime. New famotidine  rx sent. Patient asked to call back with any questions or concerns.

## 2024-03-17 NOTE — Telephone Encounter (Signed)
 Tina Arroyo, patient to continue Esomeprazole  40mg  once daily. Let her know her insurance would not cover this medication bid. She can take Famotidine  20mg  one tab at bed time, pls send RX # 30, 1 refill THX.

## 2024-03-30 ENCOUNTER — Other Ambulatory Visit: Payer: Self-pay | Admitting: *Deleted

## 2024-03-30 DIAGNOSIS — K610 Anal abscess: Secondary | ICD-10-CM

## 2024-04-14 ENCOUNTER — Other Ambulatory Visit: Payer: Self-pay | Admitting: Internal Medicine

## 2024-04-14 DIAGNOSIS — E785 Hyperlipidemia, unspecified: Secondary | ICD-10-CM

## 2024-04-15 ENCOUNTER — Ambulatory Visit (INDEPENDENT_AMBULATORY_CARE_PROVIDER_SITE_OTHER)

## 2024-04-15 ENCOUNTER — Encounter: Payer: Self-pay | Admitting: Internal Medicine

## 2024-04-15 ENCOUNTER — Ambulatory Visit: Payer: Self-pay | Admitting: Internal Medicine

## 2024-04-15 ENCOUNTER — Ambulatory Visit: Admitting: Internal Medicine

## 2024-04-15 VITALS — BP 130/80 | HR 78 | Temp 98.6°F | Ht 65.0 in | Wt 121.6 lb

## 2024-04-15 DIAGNOSIS — I10 Essential (primary) hypertension: Secondary | ICD-10-CM

## 2024-04-15 DIAGNOSIS — E785 Hyperlipidemia, unspecified: Secondary | ICD-10-CM | POA: Diagnosis not present

## 2024-04-15 DIAGNOSIS — Z23 Encounter for immunization: Secondary | ICD-10-CM

## 2024-04-15 DIAGNOSIS — R052 Subacute cough: Secondary | ICD-10-CM

## 2024-04-15 DIAGNOSIS — E049 Nontoxic goiter, unspecified: Secondary | ICD-10-CM | POA: Diagnosis not present

## 2024-04-15 DIAGNOSIS — D539 Nutritional anemia, unspecified: Secondary | ICD-10-CM

## 2024-04-15 LAB — CBC WITH DIFFERENTIAL/PLATELET
Basophils Absolute: 0 K/uL (ref 0.0–0.1)
Basophils Relative: 0.5 % (ref 0.0–3.0)
Eosinophils Absolute: 0.1 K/uL (ref 0.0–0.7)
Eosinophils Relative: 2 % (ref 0.0–5.0)
HCT: 36.3 % (ref 36.0–46.0)
Hemoglobin: 12.4 g/dL (ref 12.0–15.0)
Lymphocytes Relative: 31.6 % (ref 12.0–46.0)
Lymphs Abs: 1.6 K/uL (ref 0.7–4.0)
MCHC: 34.1 g/dL (ref 30.0–36.0)
MCV: 89 fl (ref 78.0–100.0)
Monocytes Absolute: 0.4 K/uL (ref 0.1–1.0)
Monocytes Relative: 8.3 % (ref 3.0–12.0)
Neutro Abs: 2.9 K/uL (ref 1.4–7.7)
Neutrophils Relative %: 57.6 % (ref 43.0–77.0)
Platelets: 233 K/uL (ref 150.0–400.0)
RBC: 4.08 Mil/uL (ref 3.87–5.11)
RDW: 14.7 % (ref 11.5–15.5)
WBC: 5 K/uL (ref 4.0–10.5)

## 2024-04-15 LAB — IBC + FERRITIN
Ferritin: 22 ng/mL (ref 10.0–291.0)
Iron: 67 ug/dL (ref 42–145)
Saturation Ratios: 17.2 % — ABNORMAL LOW (ref 20.0–50.0)
TIBC: 389.2 ug/dL (ref 250.0–450.0)
Transferrin: 278 mg/dL (ref 212.0–360.0)

## 2024-04-15 LAB — HEPATIC FUNCTION PANEL
ALT: 18 U/L (ref 3–35)
AST: 24 U/L (ref 5–37)
Albumin: 4.3 g/dL (ref 3.5–5.2)
Alkaline Phosphatase: 62 U/L (ref 39–117)
Bilirubin, Direct: 0.1 mg/dL (ref 0.1–0.3)
Total Bilirubin: 0.3 mg/dL (ref 0.2–1.2)
Total Protein: 7.9 g/dL (ref 6.0–8.3)

## 2024-04-15 LAB — LIPID PANEL
Cholesterol: 142 mg/dL (ref 28–200)
HDL: 45.4 mg/dL
LDL Cholesterol: 80 mg/dL (ref 10–99)
NonHDL: 96.67
Total CHOL/HDL Ratio: 3
Triglycerides: 82 mg/dL (ref 10.0–149.0)
VLDL: 16.4 mg/dL (ref 0.0–40.0)

## 2024-04-15 LAB — FOLATE: Folate: 20.6 ng/mL

## 2024-04-15 LAB — TSH: TSH: 0.49 u[IU]/mL (ref 0.35–5.50)

## 2024-04-15 LAB — VITAMIN B12: Vitamin B-12: 351 pg/mL (ref 211–911)

## 2024-04-15 MED ORDER — SHINGRIX 50 MCG/0.5ML IM SUSR: 0.5000 mL | Freq: Once | INTRAMUSCULAR | 1 refills | Status: AC

## 2024-04-15 MED ORDER — COVID-19 MRNA VAC-TRIS(PFIZER) 30 MCG/0.3ML IM SUSY: 0.3000 mL | PREFILLED_SYRINGE | Freq: Once | INTRAMUSCULAR | 0 refills | Status: AC

## 2024-04-15 NOTE — Progress Notes (Unsigned)
 "  Subjective:  Patient ID: Tina Arroyo, female    DOB: [birth year]  Age: 59 y.o. MRN: [id number]  CC: Thyroid  Problem (Right side of her thyroid  is swollen per her gastro doctor. ), Hypertension, and Hyperlipidemia   HPI Tina Arroyo presents for f/up ----  Discussed the use of AI scribe software for clinical note transcription with the patient, who gave verbal consent to proceed.  History of Present Illness Tina Arroyo is a 59 year old female with hypertension who presents with a swollen thyroid  and persistent cough.  She reports that her gastroenterologist told her her thyroid  was swollen on the right side of her neck. She experiences mild pain in this area occasionally, which she has noticed for the past two to three days since it was brought to her attention. No difficulty swallowing, fever, or chills, although she mentions feeling cold frequently.  She has been experiencing a persistent cough since March 29, 2024, following a bout of the flu. The cough has persisted for about two to three weeks and is productive of mucus. No chest pain or wheezing. She reports weight loss but does not specify the amount.  She has a history of hemorrhoids and was seeing the gastroenterologist for this issue.  She has a history of high blood pressure and is currently taking medication to manage it.     Outpatient Medications Prior to Visit  Medication Sig Dispense Refill   aMILoride (MIDAMOR) 5 MG tablet Take 5 mg by mouth daily.     Cholecalciferol  50 MCG (2000 UT) TABS Take 1 tablet (2,000 Units total) by mouth daily. 90 tablet 1   esomeprazole  (NEXIUM ) 40 MG capsule Take 1 capsule (40 mg total) by mouth daily. 30 minutes before breakfast; d/c rx for BID dosing (insurance will not cover) 30 capsule 1   famotidine  (PEPCID ) 20 MG tablet Take 1 tablet (20 mg total) by mouth at bedtime. 30 tablet 1   hydrocortisone  (ANUSOL -HC) 25 MG suppository Place 1 suppository (25 mg total)  rectally at bedtime. 5 suppository 0   rosuvastatin  (CRESTOR ) 10 MG tablet Take 1 tablet by mouth once daily 90 tablet 0   No facility-administered medications prior to visit.    ROS Review of Systems  Objective:  BP 130/80 (BP Location: Left Arm, Patient Position: Sitting, Cuff Size: Small)   Pulse 78   Temp 98.6 F (37 C) (Oral)   Ht 5' 5 (1.651 m)   Wt 121 lb 9.6 oz (55.2 kg)   SpO2 98%   BMI 20.24 kg/m   BP Readings from Last 3 Encounters:  04/15/24 130/80  03/11/24 100/60  12/11/23 118/76    Wt Readings from Last 3 Encounters:  04/15/24 121 lb 9.6 oz (55.2 kg)  03/11/24 121 lb 2 oz (54.9 kg)  12/11/23 117 lb 9.6 oz (53.3 kg)    Physical Exam Neck:     Thyroid : Thyromegaly present. No thyroid  mass or thyroid  tenderness.  Cardiovascular:     Rate and Rhythm: Normal rate and regular rhythm.     Heart sounds: Normal heart sounds, S1 normal and S2 normal. No murmur heard.    No friction rub. No gallop.     Comments: EKG--- NSR, 62 bpm No LVH, Q waves, or ST/T wave changes  Musculoskeletal:     Right lower leg: No edema.     Left lower leg: No edema.     Lab Results  Component Value Date   WBC 5.0 04/15/2024  HGB 12.4 04/15/2024   HCT 36.3 04/15/2024   PLT 233.0 04/15/2024   GLUCOSE 91 12/11/2023   CHOL 142 04/15/2024   TRIG 82.0 04/15/2024   HDL 45.40 04/15/2024   LDLCALC 80 04/15/2024   ALT 18 04/15/2024   AST 24 04/15/2024   NA 141 12/11/2023   K 3.7 12/11/2023   CL 105 12/11/2023   CREATININE 1.00 12/11/2023   BUN 14 12/11/2023   CO2 29 12/11/2023   TSH 0.49 04/15/2024   HGBA1C 5.3 12/30/2015    US  RENAL ARTERY DUPLEX COMPLETE Result Date: 08/29/2023 CLINICAL DATA:  hypertension benign EXAM: RENAL/URINARY TRACT ULTRASOUND RENAL DUPLEX DOPPLER ULTRASOUND COMPARISON:  CT abdomen and pelvis 03/30/2021. FINDINGS: Right Kidney: Length: 10.6 cm. Echogenicity within normal limits. No mass or hydronephrosis visualized. Left Kidney: Length: 10.7 cm.  Echogenicity within normal limits. No mass or hydronephrosis visualized. Bladder:  Unremarkable. RENAL DUPLEX ULTRASOUND Right Renal Artery Velocities: Origin:  59 cm/sec Mid:  97 cm/sec Hilum:  71 cm/sec Interlobar:  44 cm/sec Arcuate:  40 cm/sec Left Renal Artery Velocities: Origin:  81 cm/sec Mid:  139 cm/sec Hilum:  96 cm/sec Interlobar:  57 cm/sec Arcuate:  41 cm/sec Aortic Velocity:  53 cm/sec Right Renal-Aortic Ratios: Origin: 1.1 Mid:  1.8 Hilum: 1.3 Interlobar: 0.8 Arcuate: 0.3 Left Renal-Aortic Ratios: Origin: 1.5 Mid: 2.6 Hilum: 1.8 Interlobar: 1.1 Arcuate: 0.8 IMPRESSION: No Doppler evidence of renal artery stenosis. Electronically Signed   By: Gwynneth Akin M.D.   On: 08/29/2023 11:48    The 10-year ASCVD risk score (Arnett DK, et al., 2019) is: 5%   Values used to calculate the score:     Age: 69 years     Clinically relevant sex: Female     Is Non-Hispanic African American: Yes     Diabetic: No     Tobacco smoker: No     Systolic Blood Pressure: 130 mmHg     Is BP treated: Yes     HDL Cholesterol: 45.4 mg/dL     Total Cholesterol: 142 mg/dL   DG Chest 2 View Result Date: 04/15/2024 CLINICAL DATA:  Cough for 3 weeks EXAM: CHEST - 2 VIEW COMPARISON:  None Available. FINDINGS: The heart size and mediastinal contours are within normal limits. Both lungs are clear. The visualized skeletal structures are unremarkable. IMPRESSION: No active cardiopulmonary disease. Electronically Signed   By: Luke Bun M.D.   On: 04/15/2024 15:10     Assessment & Plan:  Thyroid  goiter -     US  THYROID ; Future -     TSH; Future  Primary hypertension -     EKG 12-Lead -     Hepatic function panel; Future -     COVID-19 mRNA Vac-TriS(Pfizer); Inject 0.3 mLs into the muscle once for 1 dose.  Dispense: 0.3 mL; Refill: 0  Deficiency anemia -     IBC + Ferritin; Future -     Reticulocytes; Future -     Vitamin B1; Future -     Zinc; Future -     Folate; Future -     CBC with  Differential/Platelet; Future -     Vitamin B12; Future  Dyslipidemia, goal LDL below 100 -     Lipid panel; Future -     Hepatic function panel; Future  Need for prophylactic vaccination and inoculation against varicella -     Shingrix ; Inject 0.5 mLs into the muscle once for 1 dose.  Dispense: 0.5 mL; Refill: 1  Subacute cough -  DG Chest 2 View; Future     Follow-up: Return in about 3 months (around 07/14/2024).  Debby Molt, MD "

## 2024-04-15 NOTE — Patient Instructions (Signed)
Goiter  A goiter is an enlarged thyroid gland. The thyroid gland is located in the lower front part of the neck, just in front of the windpipe (trachea). This gland makes hormones that affect how the body processes food for energy (metabolism) and how the heart and brain function. Most goiters are painless and are not a cause for concern. Some goiters can affect the way your thyroid makes thyroid hormones. Goiters and conditions that cause goiters can be treated, if necessary. What are the causes? This condition may be caused by: Lack of a mineral called iodine. The thyroid gland uses iodine to make thyroid hormones. Diseases that attack healthy cells in the body (autoimmune diseases) and affect thyroid function, such as Graves' disease or Hashimoto's disease. These diseases may cause the body to produce too much thyroid hormone (hyperthyroidism) or too little of the hormone (hypothyroidism). Conditions that cause inflammation of the thyroid (thyroiditis). One or more small growths on the thyroid (nodular goiter). Other causes may include: Medical problems caused by abnormal genes that are passed from parent to child (genetic defects). Thyroid injury or infection. Tumors that may or may not be cancerous. Pregnancy. Certain medicines. Exposure to radiation. In some cases, the cause may not be known. What increases the risk? The following factors may make you more likely to develop this condition: You do not get enough iodine in your diet. You have a family history of goiter. You are female. You are older than age 40. You smoke tobacco. You have had exposure to radiation. What are the signs or symptoms? The main symptom of this condition is swelling in the lower, front part of the neck. This swelling can range from a very small bump to a large lump. Other symptoms may include: A tight feeling in the throat. A hoarse voice. Coughing. Wheezing. Difficulty swallowing or breathing. Bulging  veins in the neck. Dizziness. When a goiter is the result of an overactive thyroid (hyperthyroidism), symptoms may also include: Nervousness or restlessness. Inability to tolerate heat. Unexplained weight loss. Diarrhea. Changes in heartbeat, such as skipped beats, extra beats, or a rapid heart rate. Loss of menstruation. Increased appetite. Sleep problems. When a goiter is the result of an underactive thyroid (hypothyroidism), symptoms may also include: Feeling tired (fatigue). Inability to tolerate cold. Weight gain that is not explained by a change in diet or exercise habits. Dry skin or coarse hair. Irregular menstrual periods. Constipation. Sadness or depression. In some cases, there may not be any symptoms. How is this diagnosed? This condition may be diagnosed based on your symptoms, your medical history, and a physical exam. You may have tests, such as: Blood tests to check thyroid function. Imaging tests, such as: Ultrasound. CT scan. MRI. Thyroid scan. Removal of a tissue sample (biopsy) of the goiter or any nodules. The sample will be tested to check for cancer. How is this treated? Treatment for this condition depends on the cause and your symptoms. Treatment may include: Medicines to regulate thyroid hormone levels. Anti-inflammatory medicines or steroid medicines, if the goiter is caused by inflammation. Iodine supplements or changes to your diet, if the goiter is caused by iodine deficiency. Radioactive iodine treatment. Surgery to remove your thyroid. In some cases, you may only need regular check-ups with your health care provider to monitor your condition, and you may not need treatment. Follow these instructions at home: Follow instructions from your health care provider about any changes to your diet. Take over-the-counter and prescription medicines only as told   by your health care provider. These include supplements. Do not use any products that contain  nicotine or tobacco. These products include cigarettes, chewing tobacco, and vaping devices, such as e-cigarettes. If you need help quitting, ask your health care provider. Keep all follow-up visits. Your health care provider will want to repeat blood tests to check thyroid function. Where to find more information American Thyroid Association: thyroid.org Endocrine Society: endocrine.org Contact a health care provider if: Your symptoms do not get better with treatment. You have nausea, vomiting, or diarrhea. You have a fever. You suddenly become very weak. You experience extreme restlessness. Get help right away if: You have sudden, unexplained confusion or other mental changes. You have chest pain. You have trouble breathing or swallowing. You have fast or irregular heartbeats (palpitations). These symptoms may be an emergency. Get help right away. Call 911. Do not wait to see if the symptoms will go away. Do not drive yourself to the hospital. Summary A goiter is an enlarged thyroid gland. The thyroid gland is located in the lower front part of the neck, just in front of the windpipe. The main symptom of this condition is swelling in the lower, front part of the neck. This swelling can range from a very small bump to a large lump. Treatment for this condition depends on the cause and your symptoms. You may need medicines, supplements, or regular monitoring of your condition. This information is not intended to replace advice given to you by your health care provider. Make sure you discuss any questions you have with your health care provider. Document Revised: 05/12/2021 Document Reviewed: 05/12/2021 Elsevier Patient Education  2024 [employer].

## 2024-04-19 LAB — VITAMIN B1: Vitamin B1 (Thiamine): 10 nmol/L (ref 8–30)

## 2024-04-21 LAB — LAB REPORT - SCANNED: EGFR: 73

## 2024-04-22 ENCOUNTER — Ambulatory Visit: Admitting: Internal Medicine

## 2024-04-22 ENCOUNTER — Encounter: Payer: Self-pay | Admitting: Internal Medicine

## 2024-04-22 VITALS — BP 119/87 | HR 83 | Temp 97.1°F | Resp 10 | Ht 65.0 in | Wt 121.0 lb

## 2024-04-22 DIAGNOSIS — K635 Polyp of colon: Secondary | ICD-10-CM | POA: Diagnosis not present

## 2024-04-22 DIAGNOSIS — D124 Benign neoplasm of descending colon: Secondary | ICD-10-CM

## 2024-04-22 DIAGNOSIS — D125 Benign neoplasm of sigmoid colon: Secondary | ICD-10-CM

## 2024-04-22 DIAGNOSIS — K648 Other hemorrhoids: Secondary | ICD-10-CM | POA: Diagnosis not present

## 2024-04-22 DIAGNOSIS — Z860100 Personal history of colon polyps, unspecified: Secondary | ICD-10-CM

## 2024-04-22 DIAGNOSIS — K644 Residual hemorrhoidal skin tags: Secondary | ICD-10-CM | POA: Diagnosis not present

## 2024-04-22 DIAGNOSIS — K2950 Unspecified chronic gastritis without bleeding: Secondary | ICD-10-CM | POA: Diagnosis not present

## 2024-04-22 DIAGNOSIS — R1013 Epigastric pain: Secondary | ICD-10-CM

## 2024-04-22 DIAGNOSIS — K625 Hemorrhage of anus and rectum: Secondary | ICD-10-CM

## 2024-04-22 LAB — RETICULOCYTES
ABS Retic: 78850 {cells}/uL (ref 20000–80000)
Retic Ct Pct: 1.9 %

## 2024-04-22 LAB — ZINC: Zinc: 68 ug/dL (ref 60–130)

## 2024-04-22 MED ORDER — SODIUM CHLORIDE 0.9 % IV SOLN: 500.0000 mL | Freq: Once | INTRAVENOUS | Status: DC

## 2024-04-22 NOTE — Op Note (Signed)
 [facility] Patient Name: Tina Arroyo Procedure Date: 04/22/2024 2:50 PM MRN: [id number] Endoscopist: Gordy CHRISTELLA Starch , MD, [id number] Age: 59 Referring MD:  Date of Birth: 1966-02-17 Gender: Female Account #: [id number] Procedure:                Upper GI endoscopy Indications:              Epigastric abdominal pain, Melena Medicines:                Monitored Anesthesia Care Procedure:                Pre-Anesthesia Assessment:                           - Prior to the procedure, a History and Physical                            was performed, and patient medications and                            allergies were reviewed. The patient's tolerance of                            previous anesthesia was also reviewed. The risks                            and benefits of the procedure and the sedation                            options and risks were discussed with the patient.                            All questions were answered, and informed consent                            was obtained. Prior Anticoagulants: The patient has                            taken no anticoagulant or antiplatelet agents. ASA                            Grade Assessment: II - A patient with mild systemic                            disease. After reviewing the risks and benefits,                            the patient was deemed in satisfactory condition to                            undergo the procedure.                           After obtaining informed consent, the endoscope was  passed under direct vision. Throughout the                            procedure, the patient's blood pressure, pulse, and                            oxygen saturations were monitored continuously. The                            Olympus Scope [id number] was introduced through the                            mouth, and advanced to the second part of duodenum.                            The upper GI  endoscopy was accomplished without                            difficulty. The patient tolerated the procedure                            well. Scope In: Scope Out: Findings:                 The examined esophagus was normal.                           The entire examined stomach was normal. Biopsies                            were taken with a cold forceps for Helicobacter                            pylori testing.                           The examined duodenum was normal. Complications:            No immediate complications. Estimated Blood Loss:     Estimated blood loss: none. Impression:               - Normal esophagus.                           - Normal stomach. Biopsied.                           - Normal examined duodenum. Recommendation:           - Patient has a contact number available for                            emergencies. The signs and symptoms of potential                            delayed complications were discussed with the  patient. Return to normal activities tomorrow.                            Written discharge instructions were provided to the                            patient.                           - Resume previous diet.                           - Continue present medications.                           - Await pathology results.                           - See the other procedure note for documentation of                            additional recommendations. Gordy CHRISTELLA Starch, MD 04/22/2024 3:37:01 PM This report has been signed electronically.

## 2024-04-22 NOTE — Progress Notes (Signed)
 Called to room to assist during endoscopic procedure.  Patient ID and intended procedure confirmed with present staff. Received instructions for my participation in the procedure from the performing physician.

## 2024-04-22 NOTE — Progress Notes (Signed)
 Report to PACU, RN, vss, BBS= Clear.

## 2024-04-22 NOTE — Progress Notes (Signed)
 "   GASTROENTEROLOGY PROCEDURE H&P NOTE   Primary Care Physician: Joshua Debby CROME, MD    Reason for Procedure:  Epigastric pain, melena, history of colon polyps and poor prep at last colonoscopy in November 2022  Plan:    Upper and lower endoscopy  Patient is appropriate for endoscopic procedure(s) in the ambulatory ([facility]) setting.  The nature of the procedure, as well as the risks, benefits, and alternatives were carefully and thoroughly reviewed with the patient. Ample time for discussion and questions allowed.  All questions were answered. The patient understood, was satisfied, and agreed with the plan to proceed.    HPI: Tina Arroyo is a 59 y.o. female who presents for EGD and colonoscopy.  Medical history as below.  Tolerated the prep.  No recent chest pain or shortness of breath.  No abdominal pain today.  Past Medical History:  Diagnosis Date   Anemia    Bleeding stomach ulcer 2016   Candida esophagitis (HCC)    Colon polyps    Hypertension    Internal hemorrhoids    Migraines     Past Surgical History:  Procedure Laterality Date   ABDOMINAL HYSTERECTOMY     ESOPHAGOGASTRODUODENOSCOPY N/A 02/25/2015   Procedure: ESOPHAGOGASTRODUODENOSCOPY (EGD);  Surgeon: Jerrell Sol, MD;  Location: [facility];  Service: Endoscopy;  Laterality: N/A;   FOOT SURGERY     knee     left ligament got tore up surgery   MULTIPLE TOOTH EXTRACTIONS     TUBAL LIGATION      Prior to Admission medications  Medication Sig Start Date End Date Taking? Authorizing Provider  aMILoride (MIDAMOR) 5 MG tablet Take 5 mg by mouth daily. 07/08/23  Yes [provider]  Cholecalciferol  50 MCG (2000 UT) TABS Take 1 tablet (2,000 Units total) by mouth daily. 12/11/23  Yes Joshua Debby CROME, MD  esomeprazole  (NEXIUM ) 40 MG capsule Take 1 capsule (40 mg total) by mouth daily. 30 minutes before breakfast; d/c rx for BID dosing (insurance will not cover) 03/17/24  Yes Kennedy-Smith, Colleen M,  NP  famotidine  (PEPCID ) 20 MG tablet Take 1 tablet (20 mg total) by mouth at bedtime. 03/17/24  Yes Kennedy-Smith, Colleen M, NP  rosuvastatin  (CRESTOR ) 10 MG tablet Take 1 tablet by mouth once daily 04/15/24  Yes Joshua Debby CROME, MD  hydrocortisone  (ANUSOL -HC) 25 MG suppository Place 1 suppository (25 mg total) rectally at bedtime. Patient not taking: Reported on 04/22/2024 03/11/24   Kennedy-Smith, Colleen M, NP    Current Outpatient Medications  Medication Sig Dispense Refill   aMILoride (MIDAMOR) 5 MG tablet Take 5 mg by mouth daily.     Cholecalciferol  50 MCG (2000 UT) TABS Take 1 tablet (2,000 Units total) by mouth daily. 90 tablet 1   esomeprazole  (NEXIUM ) 40 MG capsule Take 1 capsule (40 mg total) by mouth daily. 30 minutes before breakfast; d/c rx for BID dosing (insurance will not cover) 30 capsule 1   famotidine  (PEPCID ) 20 MG tablet Take 1 tablet (20 mg total) by mouth at bedtime. 30 tablet 1   rosuvastatin  (CRESTOR ) 10 MG tablet Take 1 tablet by mouth once daily 90 tablet 0   hydrocortisone  (ANUSOL -HC) 25 MG suppository Place 1 suppository (25 mg total) rectally at bedtime. (Patient not taking: Reported on 04/22/2024) 5 suppository 0   Current Facility-Administered Medications  Medication Dose Route Frequency Provider Last Rate Last Admin   0.9 %  sodium chloride  infusion  500 mL Intravenous Once Marris Frontera, Gordy HERO, MD  Allergies as of 04/22/2024 - Review Complete 04/22/2024  Allergen Reaction Noted   Tuberculin tests Other (See Comments) 08/28/2016    Family History  Problem Relation Age of Onset   Diabetes Mother    Hypertension Mother    Throat cancer Brother    Breast cancer Maternal Grandmother    Heart disease Maternal Grandmother    Diabetes Maternal Grandfather    Breast cancer Maternal Aunt    Prostate cancer Brother    Colon cancer Neg Hx    Stomach cancer Neg Hx    Rectal cancer Neg Hx    Esophageal cancer Neg Hx    Liver cancer Neg Hx     Social  History   Socioeconomic History   Marital status: Married    Spouse name: Not on file   Number of children: 3   Years of education: Not on file   Highest education level: Not on file  Occupational History   Occupation: glass blower/designer  Tobacco Use   Smoking status: Never   Smokeless tobacco: Never  Vaping Use   Vaping status: Never Used  Substance and Sexual Activity   Alcohol use: Yes    Comment: occasional   Drug use: No   Sexual activity: Not on file  Other Topics Concern   Not on file  Social History Narrative   Not on file   Social Drivers of Health   Tobacco Use: Low Risk (04/22/2024)   Patient History    Smoking Tobacco Use: Never    Smokeless Tobacco Use: Never    Passive Exposure: Not on file  Financial Resource Strain: Not on file  Food Insecurity: Not on file  Transportation Needs: Not on file  Physical Activity: Not on file  Stress: Not on file  Social Connections: Not on file  Intimate Partner Violence: Not on file  Depression (PHQ2-9): Low Risk (06/04/2023)   Depression (PHQ2-9)    PHQ-2 Score: 0  Alcohol Screen: Not on file  Housing: Not on file  Utilities: Not on file  Health Literacy: Not on file    Physical Exam: Vital signs in last 24 hours: @BP  134/87   Pulse 95   Temp (!) 97.1 F (36.2 C) (Skin)   Ht 5' 5 (1.651 m)   Wt 121 lb (54.9 kg)   SpO2 99%   BMI 20.14 kg/m  GEN: NAD EYE: Sclerae anicteric ENT: MMM CV: Non-tachycardic Pulm: CTA b/l GI: Soft, NT/ND NEURO:  Alert & Oriented x 3   Gordy Starch, MD [facility]  04/22/2024 3:11 PM  "

## 2024-04-22 NOTE — Patient Instructions (Signed)
 Thank you for letting us  take care of your healthcare needs today. PLease see some handouts given to you on Polyps and Hemorrhoids. Continue your current medications.    YOU HAD AN ENDOSCOPIC PROCEDURE TODAY AT THE [facility]:   Refer to the procedure report that was given to you for any specific questions about what was found during the examination.  If the procedure report does not answer your questions, please call your gastroenterologist to clarify.  If you requested that your care partner not be given the details of your procedure findings, then the procedure report has been included in a sealed envelope for you to review at your convenience later.  YOU SHOULD EXPECT: Some feelings of bloating in the abdomen. Passage of more gas than usual.  Walking can help get rid of the air that was put into your GI tract during the procedure and reduce the bloating. If you had a lower endoscopy (such as a colonoscopy or flexible sigmoidoscopy) you may notice spotting of blood in your stool or on the toilet paper. If you underwent a bowel prep for your procedure, you may not have a normal bowel movement for a few days.  Please Note:  You might notice some irritation and congestion in your nose or some drainage.  This is from the oxygen used during your procedure.  There is no need for concern and it should clear up in a day or so.  SYMPTOMS TO REPORT IMMEDIATELY:  Following lower endoscopy (colonoscopy or flexible sigmoidoscopy):  Excessive amounts of blood in the stool  Significant tenderness or worsening of abdominal pains  Swelling of the abdomen that is new, acute  Fever of 100F or higher  Following upper endoscopy (EGD)  Vomiting of blood or coffee ground material  New chest pain or pain under the shoulder blades  Painful or persistently difficult swallowing  New shortness of breath  Fever of 100F or higher  Black, tarry-looking stools  For urgent or emergent issues, a  gastroenterologist can be reached at any hour by calling [phone]. Do not use MyChart messaging for urgent concerns.    DIET:  We do recommend a small meal at first, but then you may proceed to your regular diet.  Drink plenty of fluids but you should avoid alcoholic beverages for 24 hours.  ACTIVITY:  You should plan to take it easy for the rest of today and you should NOT DRIVE or use heavy machinery until tomorrow (because of the sedation medicines used during the test).    FOLLOW UP: Our staff will call the number listed on your records the next business day following your procedure.  We will call around 7:15- 8:00 am to check on you and address any questions or concerns that you may have regarding the information given to you following your procedure. If we do not reach you, we will leave a message.     If any biopsies were taken you will be contacted by phone or by letter within the next 1-3 weeks.  Please call us  at [phone] if you have not heard about the biopsies in 3 weeks.    SIGNATURES/CONFIDENTIALITY: You and/or your care partner have signed paperwork which will be entered into your electronic medical record.  These signatures attest to the fact that that the information above on your After Visit Summary has been reviewed and is understood.  Full responsibility of the confidentiality of this discharge information lies with you and/or your care-partner.

## 2024-04-22 NOTE — Op Note (Signed)
 [facility] Patient Name: Tina Arroyo Procedure Date: 04/22/2024 2:50 PM MRN: [id number] Endoscopist: Gordy CHRISTELLA Starch , MD, [id number] Age: 59 Referring MD:  Date of Birth: November 25, 1965 Gender: Female Account #: [id number] Procedure:                Colonoscopy Indications:              Rectal bleeding, history of colonic polyps, last                            prep unsatisfactory; Nov 2022 at [facility] (TA x 2                            with inadequate prep), April 2018 (TA x 1) Medicines:                Monitored Anesthesia Care Procedure:                Pre-Anesthesia Assessment:                           - Prior to the procedure, a History and Physical                            was performed, and patient medications and                            allergies were reviewed. The patient's tolerance of                            previous anesthesia was also reviewed. The risks                            and benefits of the procedure and the sedation                            options and risks were discussed with the patient.                            All questions were answered, and informed consent                            was obtained. Prior Anticoagulants: The patient has                            taken no anticoagulant or antiplatelet agents. ASA                            Grade Assessment: II - A patient with mild systemic                            disease. After reviewing the risks and benefits,                            the patient was deemed in satisfactory condition to  undergo the procedure.                           After obtaining informed consent, the colonoscope                            was passed under direct vision. Throughout the                            procedure, the patient's blood pressure, pulse, and                            oxygen saturations were monitored continuously. The                            Olympus Scope  [id number] was introduced through the                            anus and advanced to the cecum, identified by                            appendiceal orifice and ileocecal valve. The                            colonoscopy was performed without difficulty. The                            patient tolerated the procedure well. The quality                            of the bowel preparation was good. The ileocecal                            valve, appendiceal orifice, and rectum were                            photographed. Scope In: 3:21:47 PM Scope Out: 3:34:00 PM Scope Withdrawal Time: 0 hours 9 minutes 29 seconds  Total Procedure Duration: 0 hours 12 minutes 13 seconds  Findings:                 The digital rectal exam was normal.                           Two sessile polyps were found in the sigmoid colon                            and descending colon. The polyps were 3 to 5 mm in                            size. These polyps were removed with a cold snare.                            Resection and retrieval were complete.  External and internal hemorrhoids were found during                            retroflexion. The hemorrhoids were large.                           The exam was otherwise without abnormality. Complications:            No immediate complications. Estimated Blood Loss:     Estimated blood loss: none. Impression:               - Two 3 to 5 mm polyps in the sigmoid colon and in                            the descending colon, removed with a cold snare.                            Resected and retrieved.                           - External and internal hemorrhoids.                           - The examination was otherwise normal. Recommendation:           - Patient has a contact number available for                            emergencies. The signs and symptoms of potential                            delayed complications were discussed with the                             patient. Return to normal activities tomorrow.                            Written discharge instructions were provided to the                            patient.                           - Resume previous diet.                           - Continue present medications.                           - Await pathology results.                           - If hemorrhoids remain symptomatic (bleeding) then                            hemorrhoidal banding can be performed in our office.                           -  Repeat colonoscopy is recommended for                            surveillance. The colonoscopy date will be                            determined after pathology results from today's                            exam become available for review. Gordy CHRISTELLA Starch, MD 04/22/2024 3:40:48 PM This report has been signed electronically.

## 2024-04-22 NOTE — Progress Notes (Signed)
 Pt states she ate a large breakfast of a sausage biscuit and hash browns at 1030 on 1/20. MD notified. Pt completed her prep and states her output is clear.

## 2024-04-23 ENCOUNTER — Telehealth: Payer: Self-pay

## 2024-04-23 NOTE — Telephone Encounter (Signed)
" °  Follow up Call-     04/22/2024    2:53 PM  Call back number  Post procedure Call Back phone  # [phone]  Permission to leave phone message Yes     Patient questions:  Do you have a fever, pain , or abdominal swelling? No. Pain Score  0 *  Have you tolerated food without any problems? Yes.    Have you been able to return to your normal activities? Yes.    Do you have any questions about your discharge instructions: Diet   No. Medications  No. Follow up visit  No.  Do you have questions or concerns about your Care? No.  Actions: * If pain score is 4 or above: No action needed, pain <4.   "

## 2024-04-24 ENCOUNTER — Ambulatory Visit
Admission: RE | Admit: 2024-04-24 | Discharge: 2024-04-24 | Disposition: A | Source: Ambulatory Visit | Attending: Internal Medicine | Admitting: Internal Medicine

## 2024-04-24 ENCOUNTER — Ambulatory Visit (HOSPITAL_BASED_OUTPATIENT_CLINIC_OR_DEPARTMENT_OTHER): Admit: 2024-04-24 | Admitting: Surgery

## 2024-04-24 ENCOUNTER — Encounter (HOSPITAL_BASED_OUTPATIENT_CLINIC_OR_DEPARTMENT_OTHER): Payer: Self-pay

## 2024-04-24 DIAGNOSIS — E049 Nontoxic goiter, unspecified: Secondary | ICD-10-CM

## 2024-04-24 SURGERY — EXCISION, MASS, RECTUM, ANAL APPROACH
Anesthesia: General

## 2024-04-27 ENCOUNTER — Other Ambulatory Visit: Payer: Self-pay | Admitting: Internal Medicine

## 2024-04-27 DIAGNOSIS — E041 Nontoxic single thyroid nodule: Secondary | ICD-10-CM | POA: Insufficient documentation

## 2024-04-27 LAB — SURGICAL PATHOLOGY

## 2024-04-28 ENCOUNTER — Ambulatory Visit: Payer: Self-pay | Admitting: Internal Medicine

## 2024-05-01 ENCOUNTER — Ambulatory Visit: Payer: Self-pay | Admitting: Surgery

## 2024-05-01 NOTE — Progress Notes (Signed)
 Sent message, via epic in basket, requesting orders in epic from Careers adviser.

## 2024-05-04 NOTE — Patient Instructions (Signed)
 SURGICAL WAITING ROOM VISITATION Patients having surgery or a procedure may have no more than 2 support people in the waiting area - these visitors may rotate in the visitor waiting room.   If the patient needs to stay at the hospital during part of their recovery, the visitor guidelines for inpatient rooms apply.  PRE-OP VISITATION  Pre-op nurse will coordinate an appropriate time for 1 support person to accompany the patient in pre-op.  This support person may not rotate.  This visitor will be contacted when the time is appropriate for the visitor to come back in the pre-op area.  To keep our patients, visitors and teammates safe and prevent the spread of respiratory illnesses over the next few months.  Temporary Visitor Restrictions  Children ages 66 and under will not be able to visit patients in [facility] under most circumstances. Visitation is not restricted outside of hospitals unless noted otherwise in the [facility] and Location Specific Visitation Guidelines at:       [email or web address]  Visitors with respiratory illnesses are discouraged from visiting and should remain at home. You are not required to quarantine at this time prior to your surgery. However, you must do this: Hand Hygiene often Do NOT share personal items Notify your provider if you are in close contact with someone who has COVID or you develop fever 100.4 or greater, new onset of sneezing, cough, sore throat, shortness of breath or body aches.  If you test positive for Covid or have been in contact with anyone that has tested positive in the last 10 days please notify you surgeon.    Your procedure is scheduled on:  Tuesday  05-12-2024  Report to [facility] Main Entrance: [address] entrance where the [employer] is available.   Report to admitting at: 05:15    AM  Call this number if you have any questions or problems the morning of surgery [phone]  DO NOT EAT OR DRINK ANYTHING  AFTER MIDNIGHT THE NIGHT PRIOR TO YOUR SURGERY / PROCEDURE.   FOLLOW  ANY ADDITIONAL PRE OP INSTRUCTIONS YOU RECEIVED FROM YOUR SURGEON'S OFFICE!!!  FLEET ENEMA: Obtain one(1) Fleet Enema (sodium phosphate 7-19 gm / 118 ml enema) and use (according to the directions on the box) the MORNING of your surgery.   If you have any questions, please contact your Surgeon's office for additional information.    Oral Hygiene is also important to reduce your risk of infection.        Remember - BRUSH YOUR TEETH THE MORNING OF SURGERY WITH YOUR REGULAR TOOTHPASTE  Do NOT smoke after Midnight the night before surgery.  STOP TAKING all Vitamins, Herbs and supplements 1 week before your surgery.   Take ONLY these medicines the morning of surgery with A SIP OF WATER:  amlodipine  (NORVASC ) esomeprazole  (NEXIUM ),   DO NOT TAKE amiloride (MIDAMOR) the morning of your surgery.  You may not have any metal on your body including hair pins, jewelry, and body piercing  Do not wear make-up, lotions, powders, perfumes  or deodorant  Do not wear nail polish including gel and S&S, artificial / acrylic nails, or any other type of covering on natural nails including finger and toenails. If you have artificial nails, gel coating, etc., that needs to be removed by a nail salon, Please have this removed prior to surgery. Not doing so may mean that your surgery could be cancelled or delayed if the Surgeon or anesthesia staff feels like they are unable  to monitor you safely.   Do not shave 48 hours prior to surgery to avoid nicks in your skin which may contribute to postoperative infections.   Contacts, Hearing Aids, dentures or bridgework may not be worn into surgery. DENTURES WILL BE REMOVED PRIOR TO SURGERY PLEASE DO NOT APPLY Poly grip OR ADHESIVES!!!  Patients discharged on the day of surgery will not be allowed to drive home.  Someone NEEDS to stay with you for the first 24 hours after anesthesia.  Do not bring  your home medications to the hospital. The Pharmacy will dispense medications listed on your medication list to you during your admission in the Hospital.  Special Instructions: Bring a copy of your healthcare power of attorney and living will documents the day of surgery, if you wish to have them scanned into your [facility] Records- EPIC  Please read over the following fact sheets you were given: IF YOU HAVE QUESTIONS ABOUT YOUR PRE-OP INSTRUCTIONS, PLEASE CALL [phone].   [facility] - Preparing for Surgery         Before surgery, you can play an important role.  Because skin is not sterile, your skin needs to be as free of germs as possible.  You can reduce the number of germs on your skin by washing with CHG (chlorahexidine gluconate) soap before surgery.  CHG is an antiseptic cleaner which kills germs and bonds with the skin to continue killing germs even after washing. Please DO NOT use if you have an allergy to CHG or antibacterial soaps.  If your skin becomes reddened/irritated stop using the CHG and inform your nurse when you arrive at Short Stay. Do not shave (including legs and underarms) for at least 48 hours prior to the first CHG shower.  You may shave your face/neck.  Please follow these instructions carefully:  1.  Shower with CHG Soap the night before surgery ONLY (DO NOT USE THE CHG SOAP THE MORNING OF SURGERY).  2.  If you choose to wash your hair, wash your hair first as usual with your normal  shampoo.  3.  After you shampoo, rinse your hair and body thoroughly to remove the shampoo.                             4.  Use CHG as you would any other liquid soap.  You can apply chg directly to the skin and wash.  Gently with a scrungie or clean washcloth.  5.  Apply the CHG Soap to your body ONLY FROM THE NECK DOWN.   Do not use on face/ open                           Wound or open sores. Avoid contact with eyes, ears mouth and genitals (private parts).                        Wash face,  Genitals (private parts) with your normal soap.             6.  Wash thoroughly, paying special attention to the area where your  surgery  will be performed.  7.  Thoroughly rinse your body with warm water from the neck down.  8.  DO NOT shower/wash with your normal soap after using and rinsing off the CHG Soap.  9.  Pat yourself dry with a clean towel.            10.  Wear clean pajamas.            11.  Place clean sheets on your bed the night of your first shower and do not  sleep with pets.  Day of Surgery : Do not apply any CHG, lotions/deodorants the morning of surgery.  Please wear clean clothes to the hospital/surgery center.   FAILURE TO FOLLOW THESE INSTRUCTIONS MAY RESULT IN THE CANCELLATION OF YOUR SURGERY  PATIENT SIGNATURE_________________________________  NURSE SIGNATURE__________________________________  ________________________________________________________________________

## 2024-05-04 NOTE — Progress Notes (Signed)
 Date of any COVID positive Test in last 90 days:  PCP - Debby Molt, MD Cardiologist - Nephrology- Ephriam Stank, MD  Chest x-ray - 04-15-2024 2v EKG - 04-15-2024  Stress Test -  ECHO -  Cardiac Cath -  CT Coronary Calcium  score:  ZIO monitor-   Pacemaker / ICD device [x]  No []  Yes   Spinal Cord Stimulator:[x]  No []  Yes       History of Sleep Apnea? []  No []  Yes   CPAP used?- []  No []  Yes    Medication on DOS: amlodipine  (NORVASC ) esomeprazole  (NEXIUM ),   Hold DOS:amiloride (MIDAMOR)  Patient has: [x]  NO Hx DM   []  Pre-DM   []  DM1  []   DM2 Does the patient monitor blood sugar?   [x]  N/A   []  No []  Yes   Blood Thinner / Instructions: none Aspirin Instructions:  none  Activity level: Able to walk up 2 flights of stairs without becoming significantly short of breath or having chest pain?   []    Yes   []  No,  would have:  Patient can perform ADLs without assistance.  []   Yes    []  No   Comments:   Anesthesia review: HTN, GERD, anemia, hx GI bleed in 2016,  migraines  Patient denies any S&S of respiratory illness or Covid - no shortness of breath, fever, cough or chest pain at PAT appointment.  Patient verbalized understanding and agreement to the Pre-Surgical Instructions that were given to them at this PAT appointment. Patient was also educated of the need to review these PAT instructions again prior to his/her surgery.I reviewed the appropriate phone numbers to call if they have any and questions or concerns.

## 2024-05-05 ENCOUNTER — Encounter (HOSPITAL_COMMUNITY)
Admission: RE | Admit: 2024-05-05 | Discharge: 2024-05-05 | Disposition: A | Source: Ambulatory Visit | Attending: Surgery

## 2024-05-05 ENCOUNTER — Other Ambulatory Visit: Payer: Self-pay

## 2024-05-05 ENCOUNTER — Encounter (HOSPITAL_COMMUNITY): Payer: Self-pay

## 2024-05-05 VITALS — BP 136/84 | HR 80 | Temp 98.6°F | Resp 16 | Ht 63.0 in | Wt 115.0 lb

## 2024-05-05 DIAGNOSIS — Z01818 Encounter for other preprocedural examination: Secondary | ICD-10-CM

## 2024-05-05 DIAGNOSIS — I10 Essential (primary) hypertension: Secondary | ICD-10-CM | POA: Insufficient documentation

## 2024-05-05 DIAGNOSIS — Z01812 Encounter for preprocedural laboratory examination: Secondary | ICD-10-CM | POA: Insufficient documentation

## 2024-05-05 HISTORY — DX: Gastro-esophageal reflux disease without esophagitis: K21.9

## 2024-05-05 LAB — CBC
HCT: 35.5 % — ABNORMAL LOW (ref 36.0–46.0)
Hemoglobin: 11.4 g/dL — ABNORMAL LOW (ref 12.0–15.0)
MCH: 29.2 pg (ref 26.0–34.0)
MCHC: 32.1 g/dL (ref 30.0–36.0)
MCV: 91 fL (ref 80.0–100.0)
Platelets: 209 10*3/uL (ref 150–400)
RBC: 3.9 MIL/uL (ref 3.87–5.11)
RDW: 14.1 % (ref 11.5–15.5)
WBC: 6.5 10*3/uL (ref 4.0–10.5)
nRBC: 0 % (ref 0.0–0.2)

## 2024-05-05 LAB — BASIC METABOLIC PANEL WITH GFR
Anion gap: 10 (ref 5–15)
BUN: 6 mg/dL (ref 6–20)
CO2: 26 mmol/L (ref 22–32)
Calcium: 9.2 mg/dL (ref 8.9–10.3)
Chloride: 106 mmol/L (ref 98–111)
Creatinine, Ser: 0.9 mg/dL (ref 0.44–1.00)
GFR, Estimated: >60 mL/min
Glucose, Bld: 99 mg/dL (ref 70–99)
Potassium: 3.4 mmol/L — ABNORMAL LOW (ref 3.5–5.1)
Sodium: 142 mmol/L (ref 135–145)

## 2024-05-12 ENCOUNTER — Encounter (HOSPITAL_COMMUNITY): Admission: RE | Payer: Self-pay | Source: Home / Self Care

## 2024-05-12 ENCOUNTER — Ambulatory Visit (HOSPITAL_COMMUNITY): Admission: RE | Admit: 2024-05-12 | Admitting: Surgery

## 2024-05-12 SURGERY — EXCISION, MASS, RECTUM, ANAL APPROACH
Anesthesia: General
